# Patient Record
Sex: Female | Born: 1960
Health system: Southern US, Community
[De-identification: ages and names within clinical notes are randomized; demographics above are authoritative.]

## PROBLEM LIST (undated history)

## (undated) DIAGNOSIS — M8580 Other specified disorders of bone density and structure, unspecified site: Secondary | ICD-10-CM

## (undated) DIAGNOSIS — M542 Cervicalgia: Secondary | ICD-10-CM

## (undated) DIAGNOSIS — F419 Anxiety disorder, unspecified: Secondary | ICD-10-CM

## (undated) DIAGNOSIS — G4700 Insomnia, unspecified: Secondary | ICD-10-CM

## (undated) DIAGNOSIS — I340 Nonrheumatic mitral (valve) insufficiency: Secondary | ICD-10-CM

## (undated) DIAGNOSIS — E7800 Pure hypercholesterolemia, unspecified: Secondary | ICD-10-CM

## (undated) DIAGNOSIS — I491 Atrial premature depolarization: Secondary | ICD-10-CM

## (undated) DIAGNOSIS — I4949 Other premature depolarization: Secondary | ICD-10-CM

## (undated) DIAGNOSIS — I071 Rheumatic tricuspid insufficiency: Secondary | ICD-10-CM

## (undated) DIAGNOSIS — I10 Essential (primary) hypertension: Secondary | ICD-10-CM

## (undated) DIAGNOSIS — I493 Ventricular premature depolarization: Secondary | ICD-10-CM

## (undated) DIAGNOSIS — G43909 Migraine, unspecified, not intractable, without status migrainosus: Secondary | ICD-10-CM

## (undated) HISTORY — DX: Other premature depolarization: I49.49

## (undated) HISTORY — DX: Cervicalgia: M54.2

## (undated) HISTORY — DX: Anxiety disorder, unspecified: F41.9

## (undated) HISTORY — DX: Pure hypercholesterolemia, unspecified: E78.00

## (undated) HISTORY — DX: Rheumatic tricuspid insufficiency: I07.1

## (undated) HISTORY — PX: TUBAL LIGATION: SHX77

## (undated) HISTORY — DX: Atrial premature depolarization: I49.1

## (undated) HISTORY — DX: Insomnia, unspecified: G47.00

## (undated) HISTORY — DX: Ventricular premature depolarization: I49.3

## (undated) HISTORY — DX: Nonrheumatic mitral (valve) insufficiency: I34.0

## (undated) HISTORY — DX: Essential (primary) hypertension: I10

## (undated) HISTORY — DX: Other specified disorders of bone density and structure, unspecified site: M85.80

## (undated) HISTORY — DX: Migraine, unspecified, not intractable, without status migrainosus: G43.909

---

## 1999-10-19 ENCOUNTER — Ambulatory Visit (HOSPITAL_COMMUNITY): Admission: RE | Admit: 1999-10-19 | Discharge: 1999-10-19 | Payer: Self-pay | Admitting: Obstetrics & Gynecology

## 2000-07-10 HISTORY — PX: COLPOSCOPY: SHX161

## 2001-02-26 ENCOUNTER — Other Ambulatory Visit: Admission: RE | Admit: 2001-02-26 | Discharge: 2001-02-26 | Payer: Self-pay | Admitting: Obstetrics & Gynecology

## 2001-07-01 ENCOUNTER — Ambulatory Visit (HOSPITAL_COMMUNITY): Admission: RE | Admit: 2001-07-01 | Discharge: 2001-07-01 | Payer: Self-pay | Admitting: Family Medicine

## 2001-07-01 ENCOUNTER — Encounter: Payer: Self-pay | Admitting: Family Medicine

## 2001-07-18 ENCOUNTER — Other Ambulatory Visit: Admission: RE | Admit: 2001-07-18 | Discharge: 2001-07-18 | Payer: Self-pay | Admitting: Obstetrics & Gynecology

## 2002-04-07 ENCOUNTER — Other Ambulatory Visit: Admission: RE | Admit: 2002-04-07 | Discharge: 2002-04-07 | Payer: Self-pay | Admitting: Obstetrics & Gynecology

## 2002-07-07 ENCOUNTER — Ambulatory Visit (HOSPITAL_COMMUNITY): Admission: RE | Admit: 2002-07-07 | Discharge: 2002-07-07 | Payer: Self-pay | Admitting: Obstetrics & Gynecology

## 2002-07-07 ENCOUNTER — Encounter: Payer: Self-pay | Admitting: Obstetrics & Gynecology

## 2002-07-10 HISTORY — PX: ABDOMINAL HYSTERECTOMY: SHX81

## 2002-11-27 ENCOUNTER — Encounter: Admission: RE | Admit: 2002-11-27 | Discharge: 2002-11-27 | Payer: Self-pay | Admitting: Otolaryngology

## 2002-11-27 ENCOUNTER — Encounter: Payer: Self-pay | Admitting: Otolaryngology

## 2003-05-06 ENCOUNTER — Other Ambulatory Visit: Admission: RE | Admit: 2003-05-06 | Discharge: 2003-05-06 | Payer: Self-pay | Admitting: Obstetrics & Gynecology

## 2003-06-01 ENCOUNTER — Observation Stay (HOSPITAL_COMMUNITY): Admission: RE | Admit: 2003-06-01 | Discharge: 2003-06-03 | Payer: Self-pay | Admitting: Obstetrics & Gynecology

## 2003-06-01 ENCOUNTER — Encounter (INDEPENDENT_AMBULATORY_CARE_PROVIDER_SITE_OTHER): Payer: Self-pay | Admitting: Specialist

## 2003-07-09 ENCOUNTER — Ambulatory Visit (HOSPITAL_COMMUNITY): Admission: RE | Admit: 2003-07-09 | Discharge: 2003-07-09 | Payer: Self-pay | Admitting: Obstetrics & Gynecology

## 2004-08-08 ENCOUNTER — Ambulatory Visit (HOSPITAL_COMMUNITY): Admission: RE | Admit: 2004-08-08 | Discharge: 2004-08-08 | Payer: Self-pay | Admitting: Obstetrics & Gynecology

## 2005-08-11 ENCOUNTER — Ambulatory Visit (HOSPITAL_COMMUNITY): Admission: RE | Admit: 2005-08-11 | Discharge: 2005-08-11 | Payer: Self-pay | Admitting: Obstetrics & Gynecology

## 2006-08-13 ENCOUNTER — Ambulatory Visit (HOSPITAL_COMMUNITY): Admission: RE | Admit: 2006-08-13 | Discharge: 2006-08-13 | Payer: Self-pay | Admitting: Obstetrics & Gynecology

## 2007-09-05 ENCOUNTER — Ambulatory Visit (HOSPITAL_COMMUNITY): Admission: RE | Admit: 2007-09-05 | Discharge: 2007-09-05 | Payer: Self-pay | Admitting: Obstetrics & Gynecology

## 2008-08-10 ENCOUNTER — Emergency Department (HOSPITAL_BASED_OUTPATIENT_CLINIC_OR_DEPARTMENT_OTHER): Admission: EM | Admit: 2008-08-10 | Discharge: 2008-08-10 | Payer: Self-pay | Admitting: Emergency Medicine

## 2008-08-10 ENCOUNTER — Ambulatory Visit: Payer: Self-pay | Admitting: Diagnostic Radiology

## 2008-09-15 ENCOUNTER — Ambulatory Visit (HOSPITAL_COMMUNITY): Admission: RE | Admit: 2008-09-15 | Discharge: 2008-09-15 | Payer: Self-pay | Admitting: Obstetrics & Gynecology

## 2009-08-11 ENCOUNTER — Encounter: Admission: RE | Admit: 2009-08-11 | Discharge: 2009-08-11 | Payer: Self-pay | Admitting: Internal Medicine

## 2009-09-29 ENCOUNTER — Ambulatory Visit (HOSPITAL_COMMUNITY): Admission: RE | Admit: 2009-09-29 | Discharge: 2009-09-29 | Payer: Self-pay | Admitting: Obstetrics & Gynecology

## 2010-07-30 ENCOUNTER — Encounter: Payer: Self-pay | Admitting: Obstetrics & Gynecology

## 2010-07-31 ENCOUNTER — Encounter: Payer: Self-pay | Admitting: Orthopaedic Surgery

## 2010-08-30 ENCOUNTER — Other Ambulatory Visit (HOSPITAL_COMMUNITY): Payer: Self-pay | Admitting: Obstetrics & Gynecology

## 2010-08-30 DIAGNOSIS — Z1231 Encounter for screening mammogram for malignant neoplasm of breast: Secondary | ICD-10-CM

## 2010-09-21 ENCOUNTER — Other Ambulatory Visit: Payer: Self-pay | Admitting: Obstetrics & Gynecology

## 2010-09-22 ENCOUNTER — Other Ambulatory Visit (HOSPITAL_COMMUNITY): Payer: Self-pay | Admitting: Obstetrics & Gynecology

## 2010-09-22 DIAGNOSIS — Z780 Asymptomatic menopausal state: Secondary | ICD-10-CM

## 2010-10-03 ENCOUNTER — Ambulatory Visit (HOSPITAL_COMMUNITY)
Admission: RE | Admit: 2010-10-03 | Discharge: 2010-10-03 | Disposition: A | Discharge status: Home / Self Care | Payer: BC Managed Care – PPO | Source: Ambulatory Visit | Attending: Obstetrics & Gynecology | Admitting: Obstetrics & Gynecology

## 2010-10-03 DIAGNOSIS — Z1231 Encounter for screening mammogram for malignant neoplasm of breast: Secondary | ICD-10-CM

## 2010-10-03 DIAGNOSIS — Z780 Asymptomatic menopausal state: Secondary | ICD-10-CM

## 2010-10-18 ENCOUNTER — Other Ambulatory Visit: Payer: Self-pay | Admitting: Gastroenterology

## 2010-10-25 LAB — CBC
HCT: 40 % (ref 36.0–46.0)
Hemoglobin: 13.8 g/dL (ref 12.0–15.0)
MCHC: 34.4 g/dL (ref 30.0–36.0)
MCV: 90.3 fL (ref 78.0–100.0)
Platelets: 269 10*3/uL (ref 150–400)
RBC: 4.43 MIL/uL (ref 3.87–5.11)
RDW: 11.5 % (ref 11.5–15.5)
WBC: 7.4 10*3/uL (ref 4.0–10.5)

## 2010-10-25 LAB — HEPATIC FUNCTION PANEL
ALT: 12 U/L (ref 0–35)
AST: 27 U/L (ref 0–37)
Albumin: 4.2 g/dL (ref 3.5–5.2)
Alkaline Phosphatase: 57 U/L (ref 39–117)
Bilirubin, Direct: 0 mg/dL (ref 0.0–0.3)
Indirect Bilirubin: 0.4 mg/dL (ref 0.3–0.9)
Total Bilirubin: 0.4 mg/dL (ref 0.3–1.2)
Total Protein: 7.2 g/dL (ref 6.0–8.3)

## 2010-10-25 LAB — POCT CARDIAC MARKERS
CKMB, poc: <1 ng/mL — ABNORMAL LOW (ref 1.0–8.0)
CKMB, poc: <1 ng/mL — ABNORMAL LOW (ref 1.0–8.0)
CKMB, poc: <1 ng/mL — ABNORMAL LOW (ref 1.0–8.0)
Myoglobin, poc: 32.6 ng/mL (ref 12–200)
Myoglobin, poc: 35 ng/mL (ref 12–200)
Myoglobin, poc: 38.6 ng/mL (ref 12–200)
Troponin i, poc: <0.05 ng/mL (ref 0.00–0.09)
Troponin i, poc: <0.05 ng/mL (ref 0.00–0.09)
Troponin i, poc: <0.05 ng/mL (ref 0.00–0.09)

## 2010-10-25 LAB — BASIC METABOLIC PANEL
BUN: 15 mg/dL (ref 6–23)
CO2: 27 mEq/L (ref 19–32)
Calcium: 8.9 mg/dL (ref 8.4–10.5)
Chloride: 105 mEq/L (ref 96–112)
Creatinine, Ser: 0.7 mg/dL (ref 0.4–1.2)
GFR calc Af Amer: >60 mL/min (ref >60)
GFR calc non Af Amer: >60 mL/min (ref >60)
Glucose, Bld: 92 mg/dL (ref 70–99)
Potassium: 3.8 mEq/L (ref 3.5–5.1)
Sodium: 138 mEq/L (ref 135–145)

## 2010-10-25 LAB — D-DIMER, QUANTITATIVE: D-Dimer, Quant: 0.29 ug/mL-FEU (ref 0.00–0.48)

## 2010-10-25 LAB — LIPASE, BLOOD: Lipase: 187 U/L (ref 23–300)

## 2010-11-25 NOTE — Op Note (Signed)
[facility]  Patient:    Alicia Irwin, Alicia Irwin                        MRN: [id number] Proc. Date: 10/19/99 Adm. Date:  [id number] Attending:  Genia Del                           Operative Report  PREOPERATIVE DIAGNOSIS:       Desires tubal sterilization.  POSTOPERATIVE DIAGNOSIS:      Desires tubal sterilization.  OPERATION:                    Bilateral tubal sterilization with electrocauterization under laparoscopy.  SURGEON:                      Genia Del, M.D.  ASSISTANT:  ANESTHESIA:                   Vita Erm, Montez Hageman., M.D.  ESTIMATED BLOOD LOSS:  DESCRIPTION OF PROCEDURE:     Under general anesthesia with endotracheal intubation, the patient is in the lithotomy position.  She is prepped with Betadine on the abdominal, suprapubic, vulvar, and vaginal areas.  The bladder catheter s done and the vaginal examination reveals an anteverted uterus, normal volume, mobile, and no adnexal mass.  The patient is draped as usual.  The speculum is introduced in the vagina.  The anterior lip of the cervix is grasped and the uterus is cannulated.  The speculum is removed.  We then proceed abdominally.  An infraumbilical incision is done with a scalpel on 10 mm.  The Veress needle is introduced while raising the abdominal wall.  A pneumoperitoneum is created with 3 liters of CO2.  When achieved, the Veress needle was removed.  The trocar is introduced as well as the laparoscope.  The pelvic cavity is inspected.  The uterus is normal in volume and appearance.  The two tubes are mobile and normal.  The wo ovaries are normal in volume and in appearance.  No pelvic lesion is present. e proceeded with cauterization of the left tube first at about 2 cm from the cornua on a distance of about 2 cm taking two bites and then in between and covering the whole width of the tube down to the broad ligament.  We proceeded exactly the same on  the right side.  Hemostasis is adequate.  Pictures are taken after tubal sterilization is done.  We then removed the instruments.  The CO2 is evacuated.  The aponeurosis is closed with a Vicryl 0 and then the skin is closed with Monocryl 4-0.  Infiltration of the subcutaneous tissue is done with Marcaine 0.25%.  The  instrument in the vagina is also removed.  The estimated blood loss was minimal, no complications occurred, and the patient was brought to the recovery room in good condition. DD:  10/19/99 TD:  10/20/99 Job: [id number]

## 2010-11-25 NOTE — H&P (Signed)
NAME:  Alicia Irwin, Alicia Irwin                           ACCOUNT NO.:  [id number]   MEDICAL RECORD NO.:  [id number]                   PATIENT TYPE:  AMB   LOCATION:  SDC                                  FACILITY:  [facility]   PHYSICIAN:  Genia Del, M.D.             DATE OF BIRTH:  08-Oct-1960   DATE OF ADMISSION:  DATE OF DISCHARGE:                                HISTORY & PHYSICAL   HISTORY:  The patient is a 50 year old Gravida II, Para 2 status post  bilateral tubal sterilization.   REASON FOR ADMISSION:  Long-standing persistent, refractory menometrorrhagia  for laparoscopic assisted vaginal hysterectomy.   HISTORY OF PRESENT ILLNESS:  The patient has had long-standing persistent  menometrorrhagia for which many attempts at medical treatment have been  made.  She cannot tolerate birth control pills secondary to nausea and  headaches.  She even attempted Nuvaring which brought the same side effects.  She is currently on Provera with which she has been bleeding on and off, but  less heavily.  She made many phone calls to the office because of lower  abdominal pains and bleeding.  She is currently on Darvocet-N 100 q.4h  p.r.n. waiting for her surgery.  The surgery was initially planned on  June 11, 2003, but because of her significant symptoms it was advanced.   PAST MEDICAL HISTORY:  1. Rhinitis.   PAST SURGICAL HISTORY:  1. Bilateral tubal sterilization by electrocauterization by laparoscopy on     October 19, 1999.   MEDICATIONS:  1. Flonase p.r.n.  2. Allegra p.r.n.   ALLERGIES:  No known drug allergies.   FAMILY HISTORY:  Noncontributory.   REVIEW OF SYMPTOMS:  CONSTITUTIONAL:  Negative.  HEENT:  Positive for  occasional nasal congestion.  RESPIRATORY:  Negative.  CARDIOVASCULAR:  Negative.  GI:  Negative.  URO:  Negative.  MUSCULOSKELETAL:  Negative.  NEUROLOGIC:  Occasional headaches.  ENDOCRINE:  Negative.  DERMATOLOGIC:  Negative.   PHYSICAL EXAMINATION:  GENERAL:   She is in no apparent distress.  VITAL SIGNS:  Blood pressure is 120 to 130 over 78 to 88; one blood pressure  on May 13, 2003 was elevated at 136/100 associated with stress.  The  patient was advised to follow-up with her primary care physician.  We will  also follow-up at the time of surgery.  Pulse 80 and regular, respiratory  rate 20, temperature is afebrile.  LUNGS:  Clear bilaterally.  CARDIOVASCULAR:  Regular cardiac rhythm.  ABDOMEN:  Soft and non-tender.  No hepatosplenomegaly.  No masses felt.  GYN EXAM:  Vulva normal, vagina and cervix normal.  Last Pap test was within  normal limits on May 06, 2003.  Uterus anteverted, about 9 cm, slightly  irregular, mobile.  No adnexal mass. Non-tender.  EXTREMITIES:  Lower limbs are normal with no edema and good pulses.   LABORATORY DATA:  Pelvic ultrasound on May 06, 2003 showed three  intrauterine myomas with one possible submucosal component.  They were  ranging from 2.2 to 2.9 cm.  Right ovary is within normal limits.  Left  ovary within normal limits with a small simple cyst of 2.8 cm.  No free  fluid.   Based on that ultrasound the patient has contemplated a possible  hysteroscopy with myomectomy and cryoablation, but later on because of all  the lower abdominal pains and the long-standing refractory uterine bleeding  she opted for a definitive surgery with an LAVH.   IMPRESSION:  Long-standing persistent, refractory menometrorrhagia with  intramural and possibly submucosal myoma.   PLAN:  Laparoscopic assisted vaginal hysterectomy.  We will use antiembolic  stockings to the thighs and PAS to the thighs. We will also discuss with the  patient the possible coverage for deep venous thrombosis and pulmonary  embolism with low dose heparin.  We will give cefazolin 1 gram IV at  induction.  The patient will prepare her bowels with NuLYTELY.  She will be  NPO from midnight the day before surgery.  Surgical risks were  discussed  including hemorrhage, infection, trauma especially to the bowels, the  ureters and to major blood vessels as well as to the bladder, risks of deep  venous thrombosis and pulmonary embolism discussed and anesthesia risks  briefly reviewed.  Those will be discussed further with the  anesthesiologist.  The procedure was discussed with the patient and she was  informed that if not possible by laparoscopy, a laparotomy approach will  have to be done.  The decision to preserve her ovaries, risks and advantages  were discussed.  The patient voiced agreement and understanding.                                               Genia Del, M.D.    ML/MEDQ  D:  05/31/2003  T:  05/31/2003  Job:  [id number]

## 2010-11-25 NOTE — Op Note (Signed)
NAME:  GIRL, SCHISSLER                           ACCOUNT NO.:  [id number]   MEDICAL RECORD NO.:  [id number]                   PATIENT TYPE:  OBV   LOCATION:  9304                                 FACILITY:  [facility]   PHYSICIAN:  Genia Del, M.D.             DATE OF BIRTH:  05/14/1961   DATE OF PROCEDURE:  06/01/2003  DATE OF DISCHARGE:                                 OPERATIVE REPORT   PREOPERATIVE DIAGNOSIS:  Refractory persistent menometrorrhagia with uterine  myomas.   POSTOPERATIVE DIAGNOSIS:  Refractory persistent menometrorrhagia with  uterine myomas.   INTERVENTION:  Laparoscopically-assisted vaginal hysterectomy.   SURGEON:  Genia Del, M.D.   ASSISTANT:  Pershing Cox, M.D.   ANESTHESIOLOGIST:  Belva Agee, M.D.   DESCRIPTION OF PROCEDURE:  Under general anesthesia with endotracheal  intubation, the patient is in lithotomy position for operative laparoscopy.  She is prepped with Hibiclens on the abdominal, suprapubic, vulvar, and  vaginal area.  The vaginal exam reveals an anteverted uterus about 8 cm,  mobile, no adnexal mass.  The patient is draped as usual.  The speculum exam  is done, the uterus is cannulated, and the speculum is removed.  We then go  abdominally.  An infraumbilical incision is made with a scalpel over 1.5 cm  after infiltrating the subcutaneous tissue with Marcaine 0.25%.  We then  proceed with an open procedure, opening the aponeurosis with the Mayo  scissors under direct vision and then opening the parietal peritoneum  bluntly with a finger.  We enter the Hasson and the laparoscope at that  level after putting a pursestring suture of 0 Vicryl at the level of the  aponeurosis.  The inspection of the pelvic cavity reveals a slightly  irregular uterus about 8 cm.  No lesion in the pelvic cavity.  Both tubes  have had a tubal sterilization by cauterization.  The right ovary is normal  in appearance and size.  The left ovary is  normal in appearance in size with  a small simple-looking cyst about 2 cm.  We verify the abdominal cavity.  The liver is normal.  The bowels are normal in appearance, and the appendix  is seen and appears normal.  Adhesions are present between the anterior wall  and the omentum toward the left.  Those do not interfere with the surgery.  We make two counter incisions at the left and right iliac areas with the  scalpel over 5 mm after infiltrating the subcutaneous tissue with Marcaine  0.25%.  We enter under direct vision after verifying the location of the  vessels.  The instruments are put in place, an atraumatic clamp and the  tripolar.  We visualize the ureters on side that are in normal anatomic  position.  We cauterize successively the left round ligament, the left utero-  ovarian ligament, and follow the uterus down to just before the left uterine  artery.  We open the visceroperitoneum anteriorly and bring the bladder  down.  We proceed exactly the same way on the right side.  Hemostasis is  adequate at all levels.  We remove the instruments and leave the trocars in  place.  We cover with a drape and pass to vaginal time.  The patient is  repositioned.  We put the weighted speculum in place.  The intrauterine  cannula is removed and replaced by a tenaculum.  We infiltrate the junction  between the cervix and vaginal mucosa with Xylocaine plus epinephrine all  around.  We then open in a circular fashion with the scalpel.  We then open  the peritoneum anteriorly and use a retractor to push the bladder away from  the surgical field.  We open also posteriorly and insert the long, narrow  weighted speculum.  We clamp the left uterosacral ligament with a Heaney.  We section and suture with 0 Vicryl on the left side, then the same way on  the right side.  We then use Heaneys to clamp, cut, and suture the uterine  arteries on each side and last clamp on each side to finish a hysterectomy.   We clamp, cut with Mayo scissors, and suture with 0 Vicryl.  Hemostasis is  adequate at all pedicles.  We switch the weighted speculum to a short one.  We then put one stitch to reapproximate the uterosacral ligaments on the  midline.  We then do the suspensory suture on the left side and on the right  side with 0 Vicryl, taking the anterior vaginal vault, the peritoneum, the  uterosacral ligaments, and the superior peritoneum and vaginal vault.  Those  are attached.  The posterior vaginal vault was also sutured for hemostasis  with a locked running suture of 0 Vicryl.  We then attached the two  uterosacral ligaments in the midline.  We close the vaginal vault from  anterior to posterior with X-stitches of 0 Vicryl.  Hemostasis is adequate  at all levels. The instruments are removed vaginally.  We go to laparoscopic  time again.  Irrigation and suction are done.  Hemostasis is adequate at all  levels except slightly toward the left at the vaginal vault.  This is  completed with the tripolar.  We remove a little bit of gas to decrease the  pressure and hemostasis is adequate.  We remove all instruments and the  trocars are removed under direct vision.  The CO2 is evacuated.  We close  the infraumbilical incision with a subcuticular 4-0 Vicryl, and we close the  iliac incisions with Dermabond.  Hemostasis is adequate at all levels.  The  estimated blood loss was 125 mL, no complication occurred, and the patient  was brought to the recovery room in good status.                                               Genia Del, M.D.    ML/MEDQ  D:  06/01/2003  T:  06/01/2003  Job:  [id number]

## 2010-11-25 NOTE — Discharge Summary (Signed)
NAME:  Alicia Irwin, Alicia Irwin                           ACCOUNT NO.:  [id number]   MEDICAL RECORD NO.:  [id number]                   PATIENT TYPE:  INP   LOCATION:  9304                                 FACILITY:  [facility]   PHYSICIAN:  Genia Del, M.D.             DATE OF BIRTH:  [birth year]   DATE OF ADMISSION:  06/01/2003  DATE OF DISCHARGE:  06/03/2003                                 DISCHARGE SUMMARY   ADMISSION DIAGNOSIS:  Refractory persistent menometrorrhagia with uterine  myomas.   DISCHARGE DIAGNOSES:  1. Refractory persistent menometrorrhagia with uterine myomas.  2. Possible heart murmur and borderline increased blood pressure.   INTERVENTION:  Laparoscopy-assisted vaginal hysterectomy with preservation  of adnexa.   HOSPITAL COURSE:  The patient was admitted on June 01, 2003, where  surgery was done on the same day.  No complication occurred. The estimated  blood loss was 125 mL.  Both ovaries were conserved.  They were normal in  appearance and size. The postoperative course was uneventful. Note that a  mild systolic murmur 1-2 parasternal was present. Her blood pressures postop  were normal, but preop they had been increased. The patient was therefore  advised to consult with Cardio after discharge. She remained hemodynamically  stable, afebrile. Her postop hemoglobin was 10.3. She was discharged on  postop day #2 on June 03, 2003, in good status. Postop advice was given.  Tylox was prescribed p.r.n. She will take Motrin over-the-counter and follow  up with me at [facility] in three weeks.                                               Genia Del, M.D.    ML/MEDQ  D:  07/04/2003  T:  07/04/2003  Job:  [id number]

## 2011-09-12 ENCOUNTER — Other Ambulatory Visit (HOSPITAL_COMMUNITY): Payer: Self-pay | Admitting: Obstetrics & Gynecology

## 2011-09-12 DIAGNOSIS — Z1231 Encounter for screening mammogram for malignant neoplasm of breast: Secondary | ICD-10-CM

## 2011-10-05 ENCOUNTER — Ambulatory Visit (HOSPITAL_COMMUNITY)
Admission: RE | Admit: 2011-10-05 | Discharge: 2011-10-05 | Disposition: A | Discharge status: Home / Self Care | Payer: BC Managed Care – PPO | Source: Ambulatory Visit | Attending: Obstetrics & Gynecology | Admitting: Obstetrics & Gynecology

## 2011-10-05 DIAGNOSIS — Z1231 Encounter for screening mammogram for malignant neoplasm of breast: Secondary | ICD-10-CM | POA: Insufficient documentation

## 2012-08-14 ENCOUNTER — Other Ambulatory Visit (HOSPITAL_COMMUNITY): Payer: Self-pay | Admitting: Obstetrics & Gynecology

## 2012-08-14 DIAGNOSIS — Z1231 Encounter for screening mammogram for malignant neoplasm of breast: Secondary | ICD-10-CM

## 2012-10-07 ENCOUNTER — Ambulatory Visit (HOSPITAL_COMMUNITY)
Admission: RE | Admit: 2012-10-07 | Discharge: 2012-10-07 | Disposition: A | Discharge status: Home / Self Care | Payer: BC Managed Care – PPO | Source: Ambulatory Visit | Attending: Obstetrics & Gynecology | Admitting: Obstetrics & Gynecology

## 2012-10-07 DIAGNOSIS — Z1231 Encounter for screening mammogram for malignant neoplasm of breast: Secondary | ICD-10-CM | POA: Insufficient documentation

## 2012-12-12 ENCOUNTER — Other Ambulatory Visit: Payer: Self-pay | Admitting: Internal Medicine

## 2012-12-12 ENCOUNTER — Ambulatory Visit
Admission: RE | Admit: 2012-12-12 | Discharge: 2012-12-12 | Disposition: A | Discharge status: Home / Self Care | Payer: BC Managed Care – PPO | Source: Ambulatory Visit | Attending: Internal Medicine | Admitting: Internal Medicine

## 2012-12-12 DIAGNOSIS — R609 Edema, unspecified: Secondary | ICD-10-CM

## 2013-05-22 ENCOUNTER — Encounter: Payer: Self-pay | Admitting: *Deleted

## 2013-05-22 ENCOUNTER — Encounter: Payer: Self-pay | Admitting: Cardiology

## 2013-05-22 DIAGNOSIS — M542 Cervicalgia: Secondary | ICD-10-CM | POA: Insufficient documentation

## 2013-05-22 DIAGNOSIS — E7800 Pure hypercholesterolemia, unspecified: Secondary | ICD-10-CM | POA: Insufficient documentation

## 2013-05-22 DIAGNOSIS — I10 Essential (primary) hypertension: Secondary | ICD-10-CM | POA: Insufficient documentation

## 2013-05-22 DIAGNOSIS — F419 Anxiety disorder, unspecified: Secondary | ICD-10-CM | POA: Insufficient documentation

## 2013-05-22 DIAGNOSIS — G4700 Insomnia, unspecified: Secondary | ICD-10-CM | POA: Insufficient documentation

## 2013-05-23 ENCOUNTER — Encounter (INDEPENDENT_AMBULATORY_CARE_PROVIDER_SITE_OTHER): Payer: Self-pay

## 2013-05-23 ENCOUNTER — Encounter: Payer: Self-pay | Admitting: Cardiology

## 2013-05-23 ENCOUNTER — Ambulatory Visit (INDEPENDENT_AMBULATORY_CARE_PROVIDER_SITE_OTHER): Payer: BC Managed Care – PPO | Admitting: Cardiology

## 2013-05-23 VITALS — BP 156/88 | HR 68 | Ht 64.0 in | Wt 125.0 lb

## 2013-05-23 DIAGNOSIS — I10 Essential (primary) hypertension: Secondary | ICD-10-CM

## 2013-05-23 DIAGNOSIS — I340 Nonrheumatic mitral (valve) insufficiency: Secondary | ICD-10-CM | POA: Insufficient documentation

## 2013-05-23 DIAGNOSIS — I4949 Other premature depolarization: Secondary | ICD-10-CM

## 2013-05-23 DIAGNOSIS — I491 Atrial premature depolarization: Secondary | ICD-10-CM

## 2013-05-23 DIAGNOSIS — I059 Rheumatic mitral valve disease, unspecified: Secondary | ICD-10-CM

## 2013-05-23 DIAGNOSIS — I493 Ventricular premature depolarization: Secondary | ICD-10-CM

## 2013-05-23 LAB — BASIC METABOLIC PANEL
BUN: 18 mg/dL (ref 6–23)
CO2: 29 mEq/L (ref 19–32)
Calcium: 9.4 mg/dL (ref 8.4–10.5)
Chloride: 103 mEq/L (ref 96–112)
Creatinine, Ser: 0.7 mg/dL (ref 0.4–1.2)
GFR: 101.31 mL/min (ref >60.00)
Glucose, Bld: 88 mg/dL (ref 70–99)
Potassium: 3.7 mEq/L (ref 3.5–5.1)
Sodium: 137 mEq/L (ref 135–145)

## 2013-05-23 NOTE — Patient Instructions (Signed)
Your physician recommends that you continue on your current medications as directed. Please refer to the Current Medication list given to you today.  Your physician recommends that you go to the lab today for a BMET Panel to be drawn  Your physician wants you to follow-up in: 6 Months with Dr Sherlyn Lick will receive a reminder letter in the mail two months in advance. If you don't receive a letter, please call our office to schedule the follow-up appointment.

## 2013-05-23 NOTE — Progress Notes (Signed)
  [address] [place], [place]  [address] Phone: [phone] Fax:  [phone]  Date:  05/23/2013  -------- ID:  Alicia Irwin, DOB 1960-09-14, MRN [id number]  PCP:  Katy Apo, MD  Cardiologist:  Armanda Magic, MD     History of Present Illness: Alicia Irwin is a 52 y.o. female with a history of PAC's, PVC's and HTN who presents today for followup.  She is doing well.  She denies any chest pain, SOB, DOE, LE edema, dizziness  or syncope.  She did not tolerate Bystolic in the past and decided not to try metoprolol.  She says that since I saw her last the palpitations improved from what they had been.  She started an all natural product food line and lost 10 pounds and feels better.  Her BP has been very well controlled at home.  The systolic has been in the range.  She says now within the past month her palpitations have been present more and she thinks she is doing too much of this diet instead of real food and she plans to cut back.    Wt Readings from Last 3 Encounters:  05/23/13 125 lb (56.7 kg)     Past Medical History  Diagnosis Date  . Cervicalgia   . Insomnia   . Anxiety   . HTN (hypertension)   . Migraine headache   . Hypercholesteremia   . Mitral regurgitation     mild to moderate by echo 07/2012  . Other premature beats     PVC's/PAC's intolerant to bystolic, not taking metoprolol  . PVC's (premature ventricular contractions)   . PAC (premature atrial contraction)     Current Outpatient Prescriptions  Medication Sig Dispense Refill  . CLIMARA 0.06 MG/24HR Place 1 patch onto the skin once a week.        No current facility-administered medications for this visit.    Allergies:   No Known Allergies  Social History:  The patient  reports that she has never smoked. She does not have any smokeless tobacco history on file. She reports that she drinks alcohol. She reports that she does not use illicit drugs.   Family History:  The patient's  family history includes Aortic aneurysm in her mother; Hypertension in her father.   ROS:  Please see the history of present illness.      All other systems reviewed and negative.   PHYSICAL EXAM: VS:  BP 156/88  Pulse 68  Ht 5\' 4"  (1.626 m)  Wt 125 lb (56.7 kg)  BMI 21.45 kg/m2 Well nourished, well developed, in no acute distress HEENT: normal Neck: no JVD Cardiac:  normal S1, S2; RRR; no murmuroccasional ectopy Lungs:  clear to auscultation bilaterally, no wheezing, rhonchi or rales Abd: soft, nontender, no hepatomegaly Ext: no edema Skin: warm and dry Neuro:  CNs 2-12 intact, no focal abnormalities noted   EKG:  NSR     ASSESSMENT AND PLAN:  1. PAC's and PVC's - increased in frequency since starting this new diet program.    - check a BMET/TSH  - I have recommended that she consider cutting back on this special clensing diet program especially it has increased her LDL and consider doing weight watchers instead 2. HTN - mildly elevated  - I have encouraged her to get into an aerobic exercise program  Followup with me in 6 months  Signed, Armanda Magic, MD 05/23/2013 10:48 AM

## 2013-06-23 ENCOUNTER — Other Ambulatory Visit: Payer: Self-pay | Admitting: Nurse Practitioner

## 2013-06-23 DIAGNOSIS — R1032 Left lower quadrant pain: Secondary | ICD-10-CM

## 2013-06-24 ENCOUNTER — Ambulatory Visit
Admission: RE | Admit: 2013-06-24 | Discharge: 2013-06-24 | Disposition: A | Discharge status: Home / Self Care | Payer: BC Managed Care – PPO | Source: Ambulatory Visit | Attending: Nurse Practitioner | Admitting: Nurse Practitioner

## 2013-06-24 DIAGNOSIS — R1032 Left lower quadrant pain: Secondary | ICD-10-CM

## 2013-10-02 ENCOUNTER — Other Ambulatory Visit (HOSPITAL_COMMUNITY): Payer: Self-pay | Admitting: Obstetrics & Gynecology

## 2013-10-02 DIAGNOSIS — Z1231 Encounter for screening mammogram for malignant neoplasm of breast: Secondary | ICD-10-CM

## 2013-10-09 ENCOUNTER — Ambulatory Visit (HOSPITAL_COMMUNITY)
Admission: RE | Admit: 2013-10-09 | Discharge: 2013-10-09 | Disposition: A | Discharge status: Home / Self Care | Payer: BC Managed Care – PPO | Source: Ambulatory Visit | Attending: Obstetrics & Gynecology | Admitting: Obstetrics & Gynecology

## 2013-10-09 DIAGNOSIS — Z1231 Encounter for screening mammogram for malignant neoplasm of breast: Secondary | ICD-10-CM | POA: Insufficient documentation

## 2013-10-21 ENCOUNTER — Other Ambulatory Visit: Payer: Self-pay | Admitting: Internal Medicine

## 2013-10-21 DIAGNOSIS — Z8249 Family history of ischemic heart disease and other diseases of the circulatory system: Secondary | ICD-10-CM

## 2013-10-21 DIAGNOSIS — M545 Low back pain, unspecified: Secondary | ICD-10-CM

## 2013-10-23 ENCOUNTER — Ambulatory Visit
Admission: RE | Admit: 2013-10-23 | Discharge: 2013-10-23 | Disposition: A | Discharge status: Home / Self Care | Payer: BC Managed Care – PPO | Source: Ambulatory Visit | Attending: Internal Medicine | Admitting: Internal Medicine

## 2013-10-23 DIAGNOSIS — M545 Low back pain, unspecified: Secondary | ICD-10-CM

## 2013-10-23 DIAGNOSIS — Z8249 Family history of ischemic heart disease and other diseases of the circulatory system: Secondary | ICD-10-CM

## 2013-12-19 ENCOUNTER — Ambulatory Visit: Payer: BC Managed Care – PPO | Admitting: Cardiology

## 2014-01-02 IMAGING — US US EXTREM LOW VENOUS*R*
1 series · 14 of 24 positions shown · non-contrast
Comparison: None.

CLINICAL DATA: Right leg swelling

RIGHT LOWER EXTREMITY VENOUS DUPLEX ULTRASOUND
TECHNIQUE: Gray-scale sonography with graded compression, as well
as color Doppler and duplex ultrasound, were performed to evaluate
the deep venous system of the lower extremity from the level of the
common femoral vein through the popliteal and proximal calf veins.
Spectral Doppler was utilized to evaluate flow at rest and with
distal augmentation maneuvers.

[Series 1: us extrem low venous*right* · 14 of 34 slices shown]
[im 1/34]
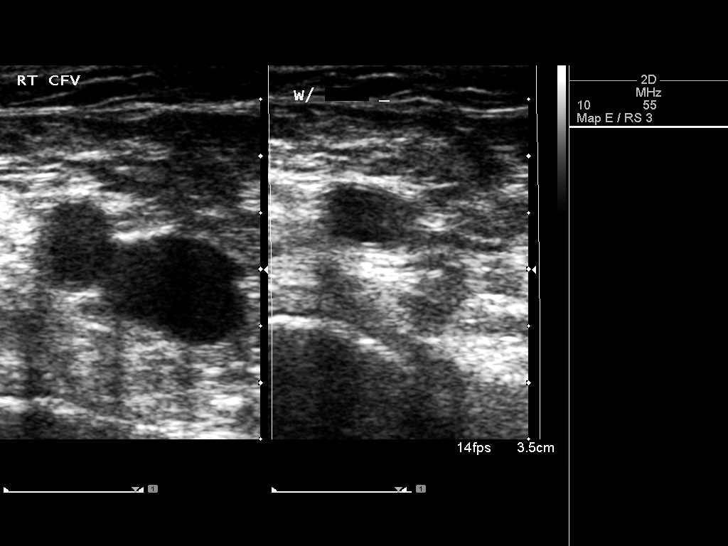
[im 3/34]
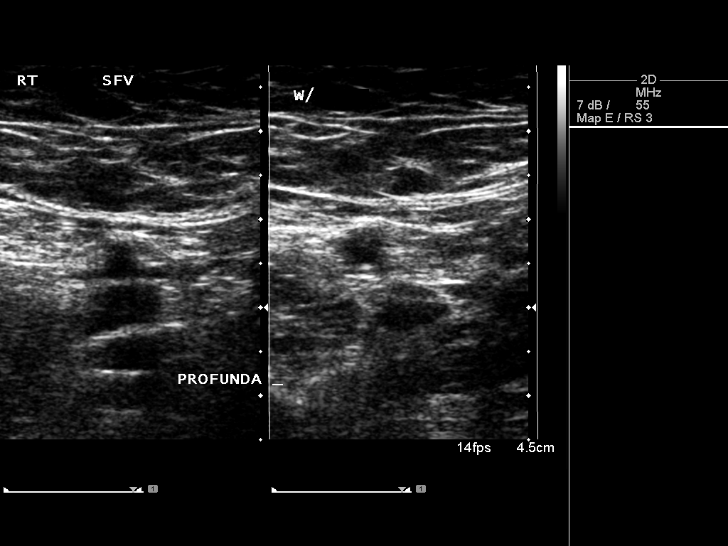
[im 6/34]
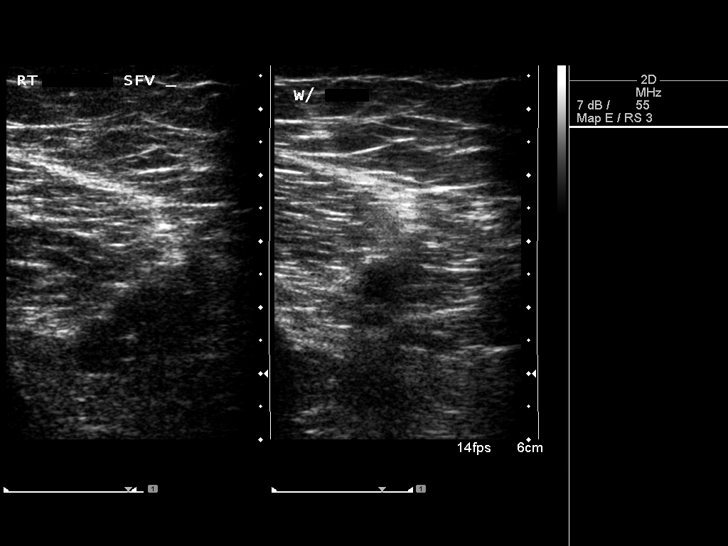
[im 9/34]
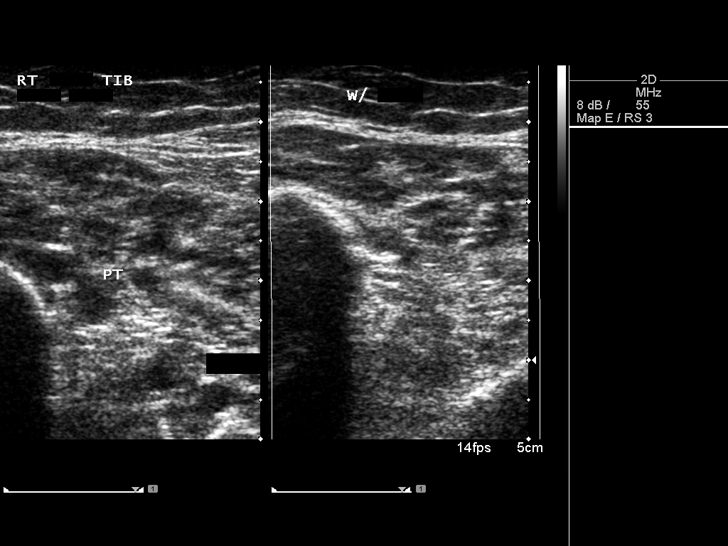
[im 11/34]
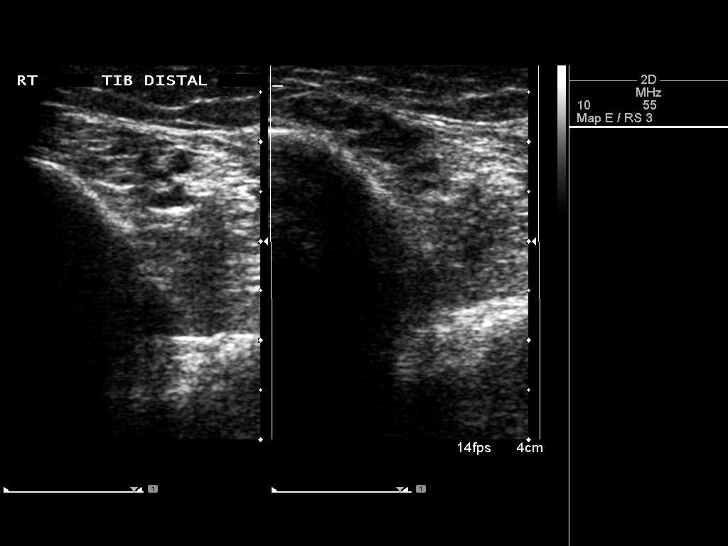
[im 13/34]
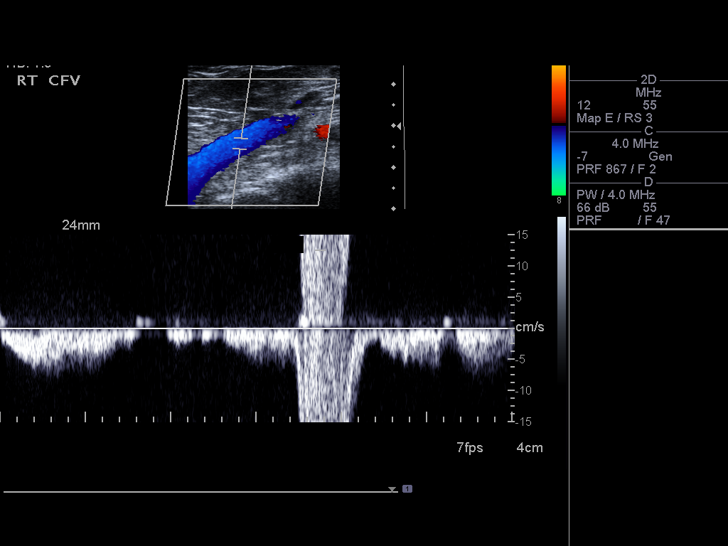
[im 16/34]
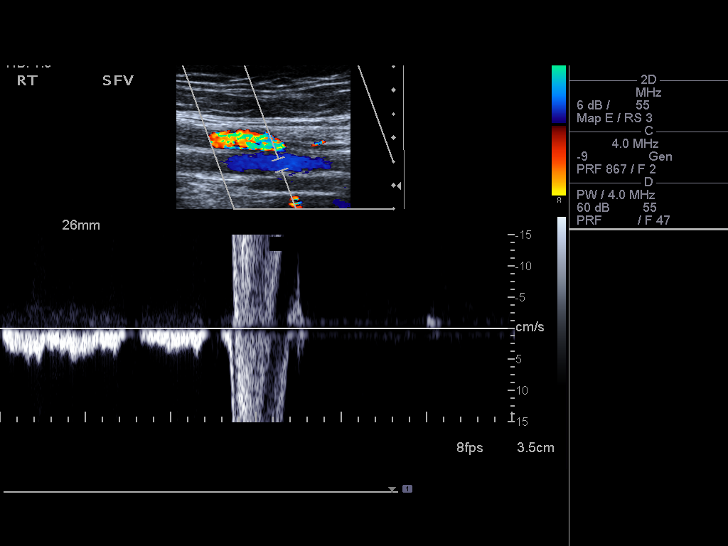
[im 18/34]
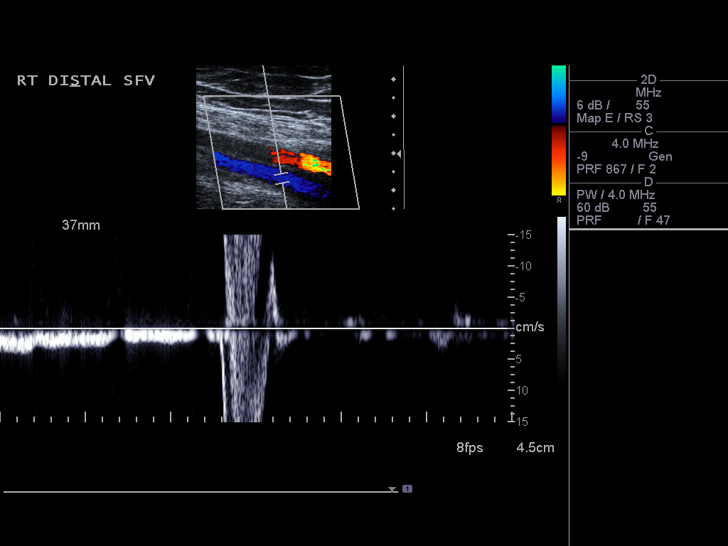
[im 21/34]
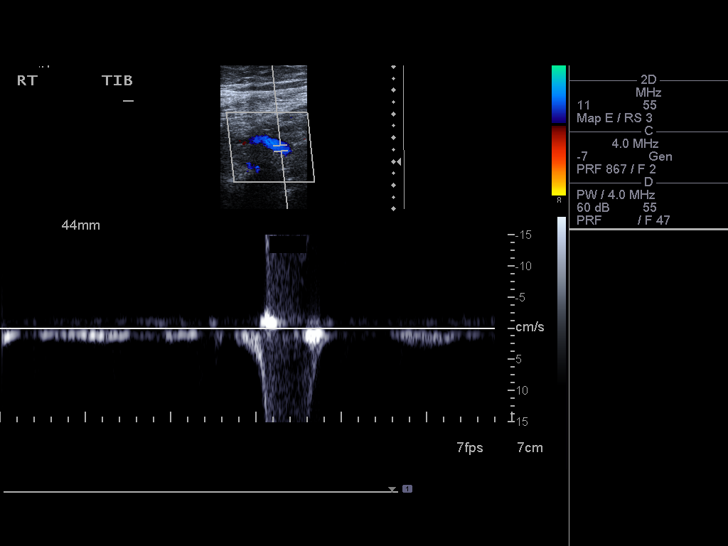
[im 23/34]
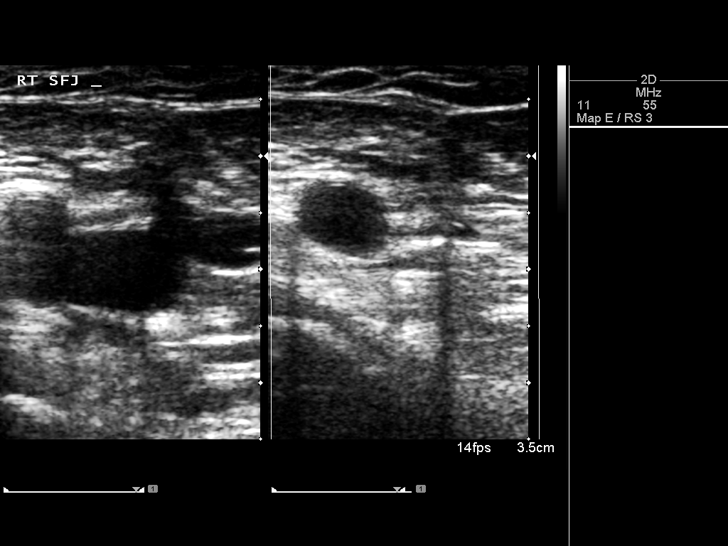
[im 26/34]
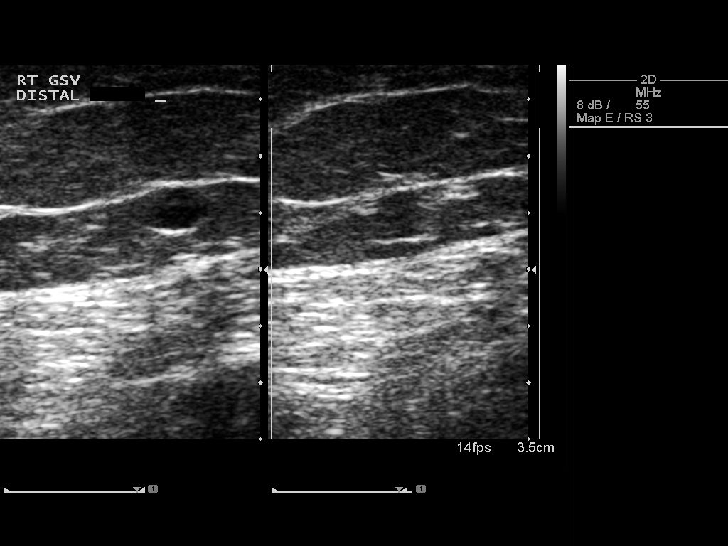
[im 28/34]
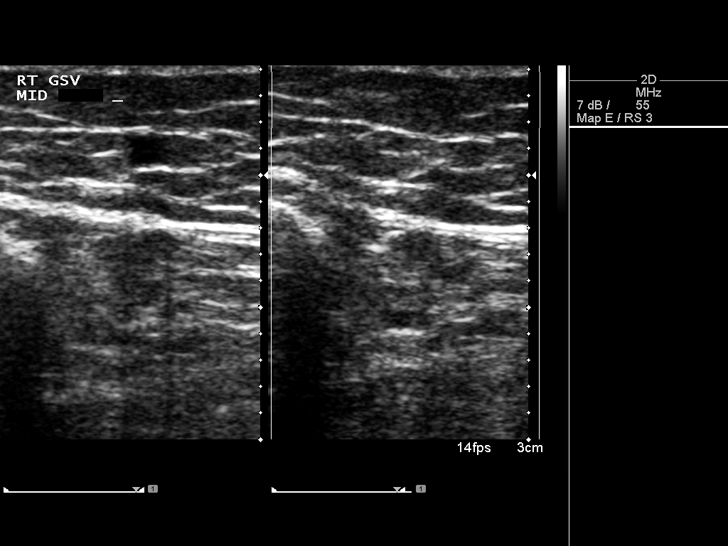
[im 31/34]
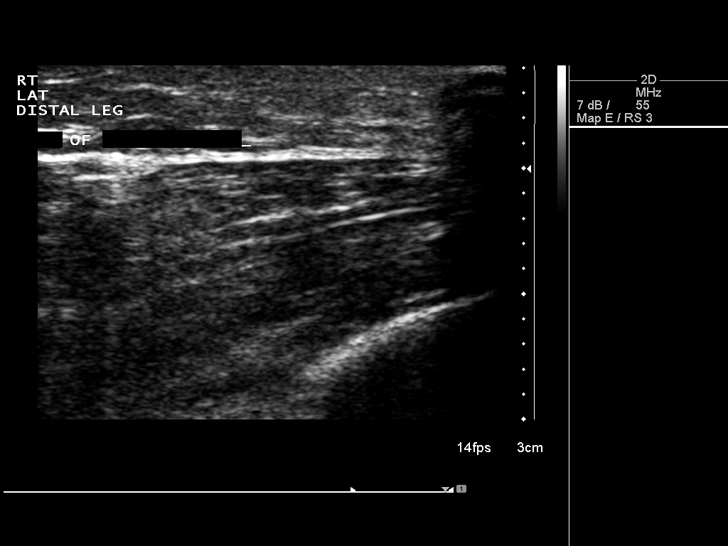
[im 34/34]
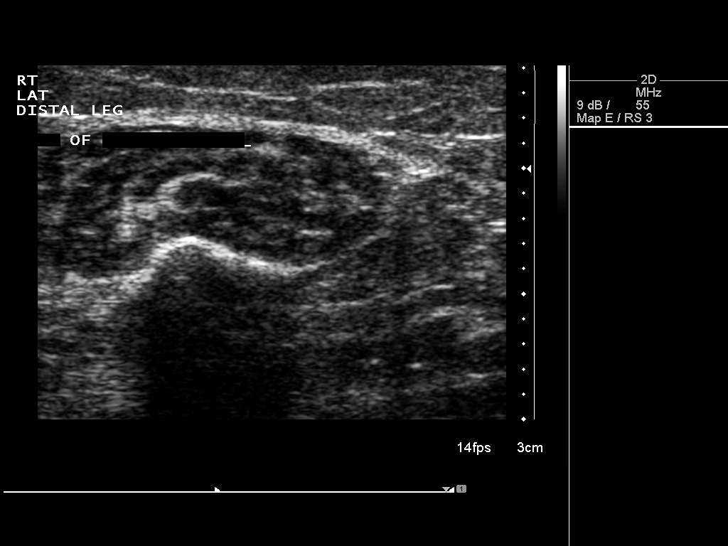

[14 of 24 positions shown; findings below may reference images not displayed]

FINDINGS: The visualized right lower extremity deep venous system
appears patent.

Normal compressibility.  Patent color Doppler flow.  Satisfactory
spectral Doppler with respiratory variation and response to
augmentation.

The greater saphenous vein, where visualized, is patent and
compressible.

The patient reported pain along the distal lateral calf.  Targeted
ultrasound of this area demonstrated no sonographic abnormality.
IMPRESSION: No deep venous thrombosis in the visualized right lower extremity.

## 2014-02-05 ENCOUNTER — Ambulatory Visit: Payer: BC Managed Care – PPO | Admitting: Cardiology

## 2014-03-11 ENCOUNTER — Ambulatory Visit (INDEPENDENT_AMBULATORY_CARE_PROVIDER_SITE_OTHER): Payer: BC Managed Care – PPO | Admitting: Cardiology

## 2014-03-11 VITALS — BP 122/80 | HR 60 | Ht 63.0 in | Wt 136.0 lb

## 2014-03-11 DIAGNOSIS — I10 Essential (primary) hypertension: Secondary | ICD-10-CM

## 2014-03-11 DIAGNOSIS — I059 Rheumatic mitral valve disease, unspecified: Secondary | ICD-10-CM

## 2014-03-11 DIAGNOSIS — I493 Ventricular premature depolarization: Secondary | ICD-10-CM

## 2014-03-11 DIAGNOSIS — I4949 Other premature depolarization: Secondary | ICD-10-CM

## 2014-03-11 DIAGNOSIS — I491 Atrial premature depolarization: Secondary | ICD-10-CM

## 2014-03-11 DIAGNOSIS — I340 Nonrheumatic mitral (valve) insufficiency: Secondary | ICD-10-CM

## 2014-03-11 NOTE — Progress Notes (Signed)
  [address] [place], [place]  [address] Phone: [phone] Fax:  [phone]  Date:  03/11/2014   ID:  Alicia, Irwin 1961/02/28, MRN [id number]  PCP:  Katy Apo, MD  Cardiologist:  Armanda Magic, MD     History of Present Illness: Alicia Irwin is a 53 y.o. female with a history of PAC's, PVC's and HTN who presents today for followup. She is doing well. She denies any chest pain, SOB, LE edema, palpitations or syncope. She did not tolerate Bystolic in the past and decided not to try metoprolol.  She says that recently she has noticed a lump in her neck that comes and goes after having a tick bite.  She was treated with antibiotics by Dr. Nehemiah Settle.  She has continued to have intermittent swelling of the lump in her neck and then started having joint pain in her arms bilaterally and she is having a workup by Dr. Nehemiah Settle.     Wt Readings from Last 3 Encounters:  03/11/14 136 lb (61.689 kg)  05/23/13 125 lb (56.7 kg)     Past Medical History  Diagnosis Date  . Cervicalgia   . Insomnia   . Anxiety   . HTN (hypertension)   . Migraine headache   . Hypercholesteremia   . Mitral regurgitation     mild to moderate by echo 07/2012  . Other premature beats     PVC's/PAC's intolerant to bystolic, not taking metoprolol  . PVC's (premature ventricular contractions)   . PAC (premature atrial contraction)     Current Outpatient Prescriptions  Medication Sig Dispense Refill  . CLIMARA 0.06 MG/24HR Place 1 patch onto the skin once a week.        No current facility-administered medications for this visit.    Allergies:   No Known Allergies  Social History:  The patient  reports that she has never smoked. She does not have any smokeless tobacco history on file. She reports that she drinks alcohol. She reports that she does not use illicit drugs.   Family History:  The patient's family history includes Aortic aneurysm in her mother; Hypertension in her father.   ROS:   Please see the history of present illness.      All other systems reviewed and negative.   PHYSICAL EXAM: VS:  BP 122/80  Pulse 60  Ht  (1.6 m)  Wt 136 lb (61.689 kg)  BMI 24.10 kg/m2 Well nourished, well developed, in no acute distress HEENT: normal Neck: no JVD Cardiac:  normal S1, S2; RRR; no murmur Lungs:  clear to auscultation bilaterally, no wheezing, rhonchi or rales Abd: soft, nontender, no hepatomegaly Ext: no edema Skin: warm and dry Neuro:  CNs 2-12 intact, no focal abnormalities noted     ASSESSMENT AND PLAN:  1. PAC's and PVC's - asymptomatic 2. HTN - well controlled  Followup with me in 6 months   Signed, Armanda Magic, MD 03/11/2014 9:44 AM

## 2014-03-11 NOTE — Patient Instructions (Signed)
Your physician recommends that you continue on your current medications as directed. Please refer to the Current Medication list given to you today.  Your physician wants you to follow-up in: 1 year with Dr. Turner. You will receive a reminder letter in the mail two months in advance. If you don't receive a letter, please call our office to schedule the follow-up appointment.  

## 2014-03-13 ENCOUNTER — Ambulatory Visit: Payer: BC Managed Care – PPO | Admitting: Cardiology

## 2014-06-29 ENCOUNTER — Other Ambulatory Visit: Payer: Self-pay | Admitting: Internal Medicine

## 2014-06-29 DIAGNOSIS — R591 Generalized enlarged lymph nodes: Secondary | ICD-10-CM

## 2014-06-29 DIAGNOSIS — R599 Enlarged lymph nodes, unspecified: Secondary | ICD-10-CM

## 2014-07-01 ENCOUNTER — Other Ambulatory Visit: Payer: BC Managed Care – PPO

## 2014-07-13 ENCOUNTER — Other Ambulatory Visit: Payer: Self-pay | Admitting: Internal Medicine

## 2014-07-13 ENCOUNTER — Ambulatory Visit
Admission: RE | Admit: 2014-07-13 | Discharge: 2014-07-13 | Disposition: A | Discharge status: Home / Self Care | Payer: BLUE CROSS/BLUE SHIELD | Source: Ambulatory Visit | Attending: Internal Medicine | Admitting: Internal Medicine

## 2014-07-13 DIAGNOSIS — R591 Generalized enlarged lymph nodes: Secondary | ICD-10-CM

## 2014-07-13 DIAGNOSIS — R599 Enlarged lymph nodes, unspecified: Secondary | ICD-10-CM

## 2014-07-15 ENCOUNTER — Other Ambulatory Visit: Payer: No Typology Code available for payment source

## 2014-07-17 ENCOUNTER — Ambulatory Visit
Admission: RE | Admit: 2014-07-17 | Discharge: 2014-07-17 | Disposition: A | Discharge status: Home / Self Care | Payer: BLUE CROSS/BLUE SHIELD | Source: Ambulatory Visit | Attending: Internal Medicine | Admitting: Internal Medicine

## 2014-07-17 DIAGNOSIS — R599 Enlarged lymph nodes, unspecified: Secondary | ICD-10-CM

## 2014-07-17 DIAGNOSIS — R591 Generalized enlarged lymph nodes: Secondary | ICD-10-CM

## 2014-07-17 MED ORDER — IOHEXOL 300 MG/ML  SOLN
75.0000 mL | Freq: Once | INTRAMUSCULAR | Status: AC | PRN
  Administered 2014-07-17: 75 mL via INTRAVENOUS

## 2014-12-16 ENCOUNTER — Encounter: Payer: Self-pay | Admitting: Cardiology

## 2015-03-31 ENCOUNTER — Ambulatory Visit (INDEPENDENT_AMBULATORY_CARE_PROVIDER_SITE_OTHER): Payer: BLUE CROSS/BLUE SHIELD | Admitting: Cardiology

## 2015-03-31 ENCOUNTER — Encounter: Payer: Self-pay | Admitting: Cardiology

## 2015-03-31 VITALS — BP 143/94 | HR 61 | Ht 63.0 in | Wt 137.8 lb

## 2015-03-31 DIAGNOSIS — I493 Ventricular premature depolarization: Secondary | ICD-10-CM

## 2015-03-31 DIAGNOSIS — I491 Atrial premature depolarization: Secondary | ICD-10-CM | POA: Diagnosis not present

## 2015-03-31 DIAGNOSIS — I10 Essential (primary) hypertension: Secondary | ICD-10-CM

## 2015-03-31 MED ORDER — AMLODIPINE BESYLATE 2.5 MG PO TABS: 2.5000 mg | ORAL_TABLET | Freq: Every day | ORAL | Status: DC

## 2015-03-31 NOTE — Progress Notes (Signed)
Cardiology Office Note   Date:  03/31/2015   ID:  Alicia Irwin, DOB 02-27-1961, MRN [id number]  PCP:  Katy Apo, MD    Chief Complaint  Patient presents with  . PCV's      History of Present Illness: Alicia Irwin is a 54 y.o. female with a history of PAC's, PVC's and HTN who presents today for followup. She is doing well. She denies any chest pain, SOB, LE edema, LE edema or syncope. Rarely she will have some palpitations but very well tolerated. She did not tolerate Bystolic in the past and decided not to try metoprolol. Her BP is elevated today but she says that she has a mild migraine HA.     Past Medical History  Diagnosis Date  . Cervicalgia   . Insomnia   . Anxiety   . HTN (hypertension)   . Migraine headache   . Hypercholesteremia   . Mitral regurgitation     mild to moderate by echo 07/2012  . Other premature beats     PVC's/PAC's intolerant to bystolic, not taking metoprolol  . PVC's (premature ventricular contractions)   . PAC (premature atrial contraction)     Past Surgical History  Procedure Laterality Date  . Tubal ligation       Current Outpatient Prescriptions  Medication Sig Dispense Refill  . CLIMARA 0.06 MG/24HR Place 1 patch onto the skin once a week.      No current facility-administered medications for this visit.    Allergies:   Contrast media    Social History:  The patient  reports that she has never smoked. She does not have any smokeless tobacco history on file. She reports that she drinks alcohol. She reports that she does not use illicit drugs.   Family History:  The patient's family history includes Aortic aneurysm in her mother; Hypertension in her father.    ROS:  Please see the history of present illness.   Otherwise, review of systems are positive for none.   All other systems are reviewed and negative.    PHYSICAL EXAM: VS:  BP 156/98 mmHg  Pulse 61  Ht  (1.6 m)  Wt 137 lb 12.8 oz (62.506  kg)  BMI 24.42 kg/m2 , BMI Body mass index is 24.42 kg/(m^2). GEN: Well nourished, well developed, in no acute distress HEENT: normal Neck: no JVD, carotid bruits, or masses Cardiac: RRR; no murmurs, rubs, or gallops,no edema  Respiratory:  clear to auscultation bilaterally, normal work of breathing GI: soft, nontender, nondistended, + BS MS: no deformity or atrophy Skin: warm and dry, no rash Neuro:  Strength and sensation are intact Psych: euthymic mood, full affect   EKG:  EKG is ordered today. The ekg ordered today demonstrates NSR at 61bpm with no ST changes and IRBBB   Recent Labs: No results found for requested labs within last 365 days.    Lipid Panel No results found for: CHOL, TRIG, HDL, CHOLHDL, VLDL, LDLCALC, LDLDIRECT    Wt Readings from Last 3 Encounters:  03/31/15 137 lb 12.8 oz (62.506 kg)  03/11/14 136 lb (61.689 kg)  05/23/13 125 lb (56.7 kg)    ASSESSMENT AND PLAN:  1. PAC's and PVC's - asymptomatic 2. HTN - elevated on exam today. I will add low dose amlodipine 2.5mg  daily and have her seen in HTN clinic in 3 weeks    Current medicines  are reviewed at length with the patient today.  The patient does not have concerns regarding medicines.  The following changes have been made:  no change  Labs/ tests ordered today: See above Assessment and Plan No orders of the defined types were placed in this encounter.     Disposition:   FU with me in 1 year  Signed, Quintella Reichert, MD  03/31/2015 2:37 PM    [facility] [address] [place], [place], [place]  [address] Phone: [phone]; Fax: [phone]

## 2015-03-31 NOTE — Patient Instructions (Addendum)
Medication Instructions:  Your physician has recommended you make the following change in your medication:  1) START AMLODIPINE 2.5 mg daily  Labwork: None  Testing/Procedures: None  Follow-Up: Your physician recommends that you schedule a follow-up appointment with Tereso Newcomer on 04/28/16 at 1150.  Your physician wants you to follow-up in: 1 year with Dr. Mayford Knife. You will receive a reminder letter in the mail two months in advance. If you don't receive a letter, please call our office to schedule the follow-up appointment.   Any Other Special Instructions Will Be Listed Below (If Applicable).

## 2015-04-28 NOTE — Progress Notes (Signed)
Cardiology Office Note   Date:  04/29/2015   ID:  Alicia Irwin, DOB [birth year], MRN [id number]  PCP:  Katy Apo, MD  Cardiologist:  Dr. Armanda Magic   Electrophysiologist:  n/a  Chief Complaint  Patient presents with  . Follow-up    HTN     History of Present Illness: Alicia Irwin is a 54 y.o. female with a hx of PACs/PVCs, HTN.  She has not tolerated Bystolic for her palpitations and decided not to take Metoprolol.  Last seen by Dr. Armanda Magic 03/31/15.  BP was uncontrolled.  She was placed on Amlodipine and returns for FU.  She is doing ok.  She has had some stressful event recently and feels anxious. She felt like she could tell her BP was running higher. She is trying to exercise to lose weight.  She denies chest pain, dyspnea, orthopnea, PND, edema.  She denies syncope.    Studies/Reports Reviewed Today:  ETT 1/14 Negative  Echo 1/14 EF 68.5%, mild to mod MR, mild TR, trace PI   Past Medical History  Diagnosis Date  . Cervicalgia   . Insomnia   . Anxiety   . HTN (hypertension)   . Migraine headache   . Hypercholesteremia   . Mitral regurgitation     mild to moderate by echo 07/2012  . Other premature beats     PVC's/PAC's intolerant to bystolic, not taking metoprolol  . PVC's (premature ventricular contractions)   . PAC (premature atrial contraction)     Past Surgical History  Procedure Laterality Date  . Tubal ligation       Current Outpatient Prescriptions  Medication Sig Dispense Refill  . amLODipine (NORVASC) 5 MG tablet Take 1 tablet (5 mg total) by mouth daily. 30 tablet 11  . CLIMARA 0.06 MG/24HR Place 1 patch onto the skin once a week.      No current facility-administered medications for this visit.    Allergies:   Contrast media    Social History:  The patient  reports that she has never smoked. She does not have any smokeless tobacco history on file. She reports that she drinks alcohol. She reports that she does not use illicit  drugs.   Family History:  The patient's family history includes Aortic aneurysm in her mother; Hypertension in her father.    ROS:   Please see the history of present illness.   Review of Systems  All other systems reviewed and are negative.     PHYSICAL EXAM: VS:  BP 142/70 mmHg  Pulse 67  Ht 5' 3.5" (1.613 m)  Wt 135 lb 1.9 oz (61.29 kg)  BMI 23.56 kg/m2    Wt Readings from Last 3 Encounters:  04/29/15 135 lb 1.9 oz (61.29 kg)  03/31/15 137 lb 12.8 oz (62.506 kg)  03/11/14 136 lb (61.689 kg)     GEN: Well nourished, well developed, in no acute distress HEENT: normal Neck: no JVD, no carotid bruits, no masses Cardiac:  Normal S1/S2, RRR; no murmur ,  no rubs or gallops, no edema   Respiratory:  clear to auscultation bilaterally, no wheezing, rhonchi or rales. GI: soft, nontender, nondistended, + BS MS: no deformity or atrophy Skin: warm and dry  Neuro:  CNs II-XII intact, Strength and sensation are intact Psych: Normal affect   EKG:  EKG is ordered today.  It demonstrates:   NSR, HR 67, normal axis, inc RBBB, no change from prior tracing, QTc 431 ms.   Recent  Labs: No results found for requested labs within last 365 days.    Lipid Panel No results found for: CHOL, TRIG, HDL, CHOLHDL, VLDL, LDLCALC, LDLDIRECT    ASSESSMENT AND PLAN:  1. HTN:  BP is improved on low dose Amlodipine but still above target.  Increase Amlodipine to 5 mg QD.  She is getting annual labs with her PCP later today for CPE.  I have asked her to get a BP machine and to check this a few times a week.  She will call us with the readings.  Adjust medication further if BP remains above target.   2. Anxiety:  Discuss with PCP further. Anxiolytic therapy may be of some benefit.     Medication Changes: Current medicines are reviewed at length with the patient today.  Concerns regarding medicines are as outlined above.  The following changes have been made:   Discontinued Medications   No  medications on file   Modified Medications   Modified Medication Previous Medication   AMLODIPINE (NORVASC) 5 MG TABLET amLODipine (NORVASC) 2.5 MG tablet      Take 1 tablet (5 mg total) by mouth daily.    Take 1 tablet (2.5 mg total) by mouth daily.   New Prescriptions   No medications on file   Labs/ tests ordered today include:   Orders Placed This Encounter  Procedures  . EKG 12-Lead      Disposition:    FU with Dr. Armanda Magicraci Turner prn.  She will call with BP readings in about 1 month.     Signed, Brynda RimScott Weaver, PA-C, MHS 04/29/2015 2:57 PM    [facility] [address], [place], [place]  [address] Phone: [phone](336) [phone]; Fax: [phone]

## 2015-04-29 ENCOUNTER — Ambulatory Visit: Payer: Self-pay | Admitting: Physician Assistant

## 2015-04-29 ENCOUNTER — Ambulatory Visit (INDEPENDENT_AMBULATORY_CARE_PROVIDER_SITE_OTHER): Payer: BLUE CROSS/BLUE SHIELD | Admitting: Physician Assistant

## 2015-04-29 ENCOUNTER — Encounter: Payer: Self-pay | Admitting: Physician Assistant

## 2015-04-29 VITALS — BP 142/70 | HR 67 | Ht 63.5 in | Wt 135.1 lb

## 2015-04-29 DIAGNOSIS — F419 Anxiety disorder, unspecified: Secondary | ICD-10-CM

## 2015-04-29 DIAGNOSIS — I10 Essential (primary) hypertension: Secondary | ICD-10-CM

## 2015-04-29 MED ORDER — AMLODIPINE BESYLATE 5 MG PO TABS: 5.0000 mg | ORAL_TABLET | Freq: Every day | ORAL | Status: DC

## 2015-04-29 NOTE — Patient Instructions (Signed)
Medication Instructions:  1. INCREASE NORVASC TO 5 MG DAILY; NEW RX SENT IN  Labwork: NONE  Testing/Procedures: NONE  Follow-Up: KEEP YOUR FOLLOW UP WITH DR. Mayford KnifeURNER AS PLANNED  Any Other Special Instructions Will Be Listed Below (If Applicable). GET A NEW BP MACHINE AND CHECK BP 3-4 TIMES A WEEK AND CALL WITH READINGS IN 1 MONTH [phone]

## 2015-11-19 DIAGNOSIS — D2262 Melanocytic nevi of left upper limb, including shoulder: Secondary | ICD-10-CM | POA: Diagnosis not present

## 2015-11-19 DIAGNOSIS — D2261 Melanocytic nevi of right upper limb, including shoulder: Secondary | ICD-10-CM | POA: Diagnosis not present

## 2015-11-19 DIAGNOSIS — D2271 Melanocytic nevi of right lower limb, including hip: Secondary | ICD-10-CM | POA: Diagnosis not present

## 2015-11-19 DIAGNOSIS — D2230 Melanocytic nevi of unspecified part of face: Secondary | ICD-10-CM | POA: Diagnosis not present

## 2016-02-09 DIAGNOSIS — Z1231 Encounter for screening mammogram for malignant neoplasm of breast: Secondary | ICD-10-CM | POA: Diagnosis not present

## 2016-02-09 DIAGNOSIS — Z01419 Encounter for gynecological examination (general) (routine) without abnormal findings: Secondary | ICD-10-CM | POA: Diagnosis not present

## 2016-02-09 DIAGNOSIS — Z6824 Body mass index (BMI) 24.0-24.9, adult: Secondary | ICD-10-CM | POA: Diagnosis not present

## 2016-03-17 ENCOUNTER — Encounter: Payer: Self-pay | Admitting: Nurse Practitioner

## 2016-04-03 ENCOUNTER — Encounter (INDEPENDENT_AMBULATORY_CARE_PROVIDER_SITE_OTHER): Payer: Self-pay

## 2016-04-03 ENCOUNTER — Ambulatory Visit (INDEPENDENT_AMBULATORY_CARE_PROVIDER_SITE_OTHER): Payer: BLUE CROSS/BLUE SHIELD | Admitting: Nurse Practitioner

## 2016-04-03 ENCOUNTER — Encounter: Payer: Self-pay | Admitting: Nurse Practitioner

## 2016-04-03 VITALS — BP 124/82 | HR 78 | Ht 63.5 in | Wt 139.0 lb

## 2016-04-03 DIAGNOSIS — I10 Essential (primary) hypertension: Secondary | ICD-10-CM

## 2016-04-03 DIAGNOSIS — I493 Ventricular premature depolarization: Secondary | ICD-10-CM | POA: Diagnosis not present

## 2016-04-03 DIAGNOSIS — I491 Atrial premature depolarization: Secondary | ICD-10-CM | POA: Diagnosis not present

## 2016-04-03 DIAGNOSIS — F419 Anxiety disorder, unspecified: Secondary | ICD-10-CM

## 2016-04-03 MED ORDER — AMLODIPINE BESYLATE 5 MG PO TABS: 5.0000 mg | ORAL_TABLET | Freq: Every day | ORAL | 3 refills | Status: DC

## 2016-04-03 NOTE — Progress Notes (Signed)
CARDIOLOGY OFFICE NOTE  Date:  04/03/2016    Alicia Irwin Date of Birth: 1961-06-08 Medical Record #[id number]  PCP:  Katy Apo, MD  Cardiologist:  Mayford Knife  Chief Complaint  Patient presents with  . Irregular Heart Beat  . Hypertension    1 year check - seen for Dr. Mayford Knife    History of Present Illness: Alicia Irwin is a 55 y.o. female who presents today for an approximate one year check. Seen for Dr. Mayford Knife.   She has a hx of PACs/PVCs, HTN.  She has not tolerated Bystolic for her palpitations and decided not to take Metoprolol.  Last seen by Dr. Armanda Magic 03/31/15.  BP was uncontrolled.  She was placed on Amlodipine.  Last seen by Tereso Newcomer, PA back in October and she was felt to be doing ok.   Comes in today. Here alone. She notes she is doing well. Says she is not under as much stress - has had significant stress in the past - her sons were murdered back in 2011. 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 No chest pain. No real palpitations. Needs her medicine refilled today. Does admit she has gotten off track with diet/exercise.   Past Medical History:  Diagnosis Date  . Anxiety   . Cervicalgia   . HTN (hypertension)   . Hypercholesteremia   . Insomnia   . Migraine headache   . Mitral regurgitation    mild to moderate by echo 07/2012  . Other premature beats    PVC's/PAC's intolerant to bystolic, not taking metoprolol  . PAC (premature atrial contraction)   . PVC's (premature ventricular contractions)     Past Surgical History:  Procedure Laterality Date  . TUBAL LIGATION       Medications: Current Outpatient Prescriptions  Medication Sig Dispense Refill  . amLODipine (NORVASC) 5 MG tablet Take 1 tablet (5 mg total) by mouth daily. 90 tablet 3  . CLIMARA 0.06 MG/24HR Place 1 patch onto the skin once a week.     Marland Kitchen ibuprofen (ADVIL,MOTRIN) 200 MG tablet Take 200 mg by mouth as  needed for fever, headache or mild pain.     No current facility-administered medications for this visit.     Allergies: Allergies  Allergen Reactions  . Contrast Media [Iodinated Diagnostic Agents] Hives and Rash    Patient to get pre meds before ct scans     Social History: The patient  reports that she has never smoked. She has never used smokeless tobacco. She reports that she drinks alcohol. She reports that she does not use drugs.   Family History: The patient's family history includes Aortic aneurysm in her mother; Hypertension in her father.   Review of Systems: Please see the history of present illness.   Otherwise, the review of systems is positive for none.   All other systems are reviewed and negative.   Physical Exam: VS:  BP 124/82   Pulse 78   Ht 5' 3.5" (1.613 m)   Wt 139 lb (63 kg)   BMI 24.24 kg/m  .  BMI Body mass index is 24.24 kg/m.  Wt Readings from Last 3 Encounters:  04/03/16 139 lb (63 kg)  04/29/15 135 lb 1.9 oz (61.3 kg)  03/31/15 137 lb 12.8 oz (62.5 kg)    General: Pleasant. Well developed, well nourished and in no acute distress.   HEENT:  Normal.  Neck: Supple, no JVD, carotid bruits, or masses noted.  Cardiac: Regular rate and rhythm. No murmurs, rubs, or gallops. No edema.  Respiratory:  Lungs are clear to auscultation bilaterally with normal work of breathing.  GI: Soft and nontender.  MS: No deformity or atrophy. Gait and ROM intact.  Skin: Warm and dry. Color is normal.  Neuro:  Strength and sensation are intact and no gross focal deficits noted.  Psych: Alert, appropriate and with normal affect.   LABORATORY DATA:  EKG:  EKG is ordered today. This demonstrates NSR with RSR'.  Lab Results  Component Value Date   WBC 7.4 08/10/2008   HGB 13.8 08/10/2008   HCT 40.0 08/10/2008   PLT 269 08/10/2008   GLUCOSE 88 05/23/2013   ALT 12 08/10/2008   AST 27 08/10/2008   NA 137 05/23/2013   K 3.7 05/23/2013   CL 103 05/23/2013    CREATININE 0.7 05/23/2013   BUN 18 05/23/2013   CO2 29 05/23/2013    BNP (last 3 results) No results for input(s): BNP in the last 8760 hours.  ProBNP (last 3 results) No results for input(s): PROBNP in the last 8760 hours.   Other Studies Reviewed Today:   Assessment/Plan: 1. HTN:  BP looks great on her current regimen. Norvasc refilled today.   2. Anxiety:  Does not really seem to be an issue today.   3. Palpitations - seems resolved at this time.   Current medicines are reviewed with the patient today.  The patient does not have concerns regarding medicines other than what has been noted above.  The following changes have been made:  See above.  Labs/ tests ordered today include:    Orders Placed This Encounter  Procedures  . EKG 12-Lead     Disposition:   FU with us in one year. I will be happy to see her back.   Patient is agreeable to this plan and will call if any problems develop in the interim.   Signed: Rosalio MacadamiaLori C. Alford Gamero, RN, ANP-C 04/03/2016 10:51 AM  [facility] [address] [place], [place]  [address] Phone: [phone](336) [phone] Fax: [phone]

## 2016-04-03 NOTE — Patient Instructions (Addendum)
We will be checking the following labs today - NONE  Ask Dr. Nehemiah SettlePolite to send us a copy of your labs at your physical next month.    Medication Instructions:    Continue with your current medicines.   I refilled the Norvasc today    Testing/Procedures To Be Arranged:  N/A  Follow-Up:   See us back in one year    Other Special Instructions:   Think about what we talked about today    If you need a refill on your cardiac medications before your next appointment, please call your pharmacy.   Call the [facility] office at [phone] if you have any questions, problems or concerns.

## 2016-04-19 DIAGNOSIS — L570 Actinic keratosis: Secondary | ICD-10-CM | POA: Diagnosis not present

## 2016-04-27 ENCOUNTER — Other Ambulatory Visit: Payer: Self-pay | Admitting: Physician Assistant

## 2016-04-27 DIAGNOSIS — I10 Essential (primary) hypertension: Secondary | ICD-10-CM

## 2016-04-27 NOTE — Telephone Encounter (Signed)
amLODipine (NORVASC) 5 MG tablet  Medication  Date: 04/03/2016 Department: [facility] [address] Office Ordering/Authorizing: Rosalio MacadamiaLori C Gerhardt, NP  Order Providers   Prescribing Provider Encounter Provider  Rosalio MacadamiaLori C Gerhardt, NP Rosalio MacadamiaLori C Gerhardt, NP  Supervision Information   Supervising Provider Type of Supervision  Peter M Swazil[place]Jordan, MD Incident To  Medication Detail    Disp Refills Start End   amLODipine (NORVASC) 5 MG tablet 90 tablet 3 04/03/2016    Sig - Route: Take 1 tablet (5 mg total) by mouth daily. - Oral   E-Prescribing Status: Receipt confirmed by pharmacy (04/03/2016 10:51 AM EDT)   Associated Diagnoses   Essential hypertension - Primary     Pharmacy   [id number] - [place], [place] - [address] [facility] [address]

## 2016-05-16 DIAGNOSIS — Z0000 Encounter for general adult medical examination without abnormal findings: Secondary | ICD-10-CM | POA: Diagnosis not present

## 2016-12-12 DIAGNOSIS — L821 Other seborrheic keratosis: Secondary | ICD-10-CM | POA: Diagnosis not present

## 2016-12-12 DIAGNOSIS — L738 Other specified follicular disorders: Secondary | ICD-10-CM | POA: Diagnosis not present

## 2016-12-12 DIAGNOSIS — D2261 Melanocytic nevi of right upper limb, including shoulder: Secondary | ICD-10-CM | POA: Diagnosis not present

## 2016-12-12 DIAGNOSIS — L570 Actinic keratosis: Secondary | ICD-10-CM | POA: Diagnosis not present

## 2016-12-12 DIAGNOSIS — B078 Other viral warts: Secondary | ICD-10-CM | POA: Diagnosis not present

## 2017-02-26 ENCOUNTER — Ambulatory Visit (INDEPENDENT_AMBULATORY_CARE_PROVIDER_SITE_OTHER): Payer: BLUE CROSS/BLUE SHIELD | Admitting: Obstetrics & Gynecology

## 2017-02-26 ENCOUNTER — Encounter: Payer: Self-pay | Admitting: Obstetrics & Gynecology

## 2017-02-26 VITALS — BP 134/90 | Ht 63.0 in | Wt 141.0 lb

## 2017-02-26 DIAGNOSIS — Z01411 Encounter for gynecological examination (general) (routine) with abnormal findings: Secondary | ICD-10-CM | POA: Diagnosis not present

## 2017-02-26 DIAGNOSIS — N393 Stress incontinence (female) (male): Secondary | ICD-10-CM

## 2017-02-26 DIAGNOSIS — N951 Menopausal and female climacteric states: Secondary | ICD-10-CM

## 2017-02-26 DIAGNOSIS — Z780 Asymptomatic menopausal state: Secondary | ICD-10-CM

## 2017-02-26 DIAGNOSIS — N760 Acute vaginitis: Secondary | ICD-10-CM | POA: Diagnosis not present

## 2017-02-26 DIAGNOSIS — Z1151 Encounter for screening for human papillomavirus (HPV): Secondary | ICD-10-CM | POA: Diagnosis not present

## 2017-02-26 DIAGNOSIS — Z90710 Acquired absence of both cervix and uterus: Secondary | ICD-10-CM

## 2017-02-26 MED ORDER — ESTRADIOL 0.0375 MG/24HR TD PTWK: 0.0375 mg | MEDICATED_PATCH | TRANSDERMAL | 4 refills | Status: DC

## 2017-02-26 NOTE — Progress Notes (Signed)
Alicia Irwin Dec 23, 1960 [id number]   History:    56 y.o. G2P2L1 Married.  Traveling with husband who is a Optician, dispensing for [employer]  RP:  Established patient presenting for annual gyn exam   HPI:  Menopause.  D/ced Estradiol patch 0.06 last year and severe hot flashes/night sweats since then with decreased sleep and tiredness.  S/P Hysterectomy.  Breasts wnl.  Screening Mammo neg 02/2016.  C/O SUI, not doing Kegel exercises.  BMs wnl.  Past medical history,surgical history, family history and social history were all reviewed and documented in the EPIC chart.  Gynecologic History No LMP recorded. Patient has had a hysterectomy. Contraception: post menopausal status Last Pap: 2016. Results were: normal Last mammogram: 02/2016. Results were: normal BD 2014 [place] 2012  Obstetric History OB History  Gravida Para Term Preterm AB Living  2 2       1   SAB TAB Ectopic Multiple Live Births               # Outcome Date GA Lbr Len/2nd Weight Sex Delivery Anes PTL Lv  2 Para           1 Para                ROS: A ROS was performed and pertinent positives and negatives are included in the history.  GENERAL: No fevers or chills. HEENT: No change in vision, no earache, sore throat or sinus congestion. NECK: No pain or stiffness. CARDIOVASCULAR: No chest pain or pressure. No palpitations. PULMONARY: No shortness of breath, cough or wheeze. GASTROINTESTINAL: No abdominal pain, nausea, vomiting or diarrhea, melena or bright red blood per rectum. GENITOURINARY: No urinary frequency, urgency, hesitancy or dysuria. MUSCULOSKELETAL: No joint or muscle pain, no back pain, no recent trauma. DERMATOLOGIC: No rash, no itching, no lesions. ENDOCRINE: No polyuria, polydipsia, no heat or cold intolerance. No recent change in weight. HEMATOLOGICAL: No anemia or easy bruising or bleeding. NEUROLOGIC: No headache, seizures, numbness, tingling or weakness. PSYCHIATRIC: No depression, no loss of interest in normal  activity or change in sleep pattern.     Exam:   BP 134/90   Ht 5\' 3"  (1.6 m)   Wt 141 lb (64 kg)   BMI 24.98 kg/m   Body mass index is 24.98 kg/m.  General appearance : Well developed well nourished female. No acute distress HEENT: Eyes: no retinal hemorrhage or exudates,  Neck supple, trachea midline, no carotid bruits, no thyroidmegaly Lungs: Clear to auscultation, no rhonchi or wheezes, or rib retractions  Heart: Regular rate and rhythm, no murmurs or gallops Breast:Examined in sitting and supine position were symmetrical in appearance, no palpable masses or tenderness,  no skin retraction, no nipple inversion, no nipple discharge, no skin discoloration, no axillary or supraclavicular lymphadenopathy Abdomen: no palpable masses or tenderness, no rebound or guarding Extremities: no edema or skin discoloration or tenderness  Pelvic: Vulva normal  Bartholin, Urethra, Skene Glands: Within normal limits             Vagina: No gross lesions or discharge.  Pap reflex done.  Cervix/Uterus Absent  Adnexa  Without masses or tenderness  Anus and perineum  normal     Assessment/Plan:  56 y.o. female for annual exam   1. Encounter for gynecological examination with abnormal finding Gyn exam s/p Hysterectomy.  Pap reflex done.  Breasts wnl.  Schedule screening Mammo.  2. H/O total hysterectomy  3. Menopause present Very symptomatic with hot flashes/night sweats x Estradiol  patch was d/ced.  Decision to restart on a lower dosage with an Estradiol 0.0375 patch qweek.  Usage/Risks/Benefits reviewed.  4. SUI (stress urinary incontinence, female) Moderate SUI.  Recommend starting Kegel exercises to strengthen the pelvic floor.  Instructions given.  Counseling on above issues >50% x 10 minutes.  Genia Del MD, 3:04 PM 02/26/2017

## 2017-02-27 LAB — PAP, TP IMAGING W/ HPV RNA, RFLX HPV TYPE 16,18/45: HPV mRNA, High Risk: NOT DETECTED

## 2017-03-04 NOTE — Patient Instructions (Signed)
1. Encounter for gynecological examination with abnormal finding Gyn exam s/p Hysterectomy.  Pap reflex done.  Breasts wnl.  Schedule screening Mammo.  2. H/O total hysterectomy  3. Menopause present Very symptomatic with hot flashes/night sweats x Estradiol patch was d/ced.  Decision to restart on a lower dosage with an Estradiol 0.0375 patch qweek.  Usage/Risks/Benefits reviewed.  4. SUI (stress urinary incontinence, female) Moderate SUI.  Recommend starting Kegel exercises to strengthen the pelvic floor.  Instructions given.  Alicia Irwin, it was a pleasure to see you today!  I will inform you of your results as soon as available.    Kegel Exercises Kegel exercises help strengthen the muscles that support the rectum, vagina, small intestine, bladder, and uterus. Doing Kegel exercises can help:  Improve bladder and bowel control.  Improve sexual response.  Reduce problems and discomfort during pregnancy.  Kegel exercises involve squeezing your pelvic floor muscles, which are the same muscles you squeeze when you try to stop the flow of urine. The exercises can be done while sitting, standing, or lying down, but it is best to vary your position. Phase 1 exercises 1. Squeeze your pelvic floor muscles tight. You should feel a tight lift in your rectal area. If you are a female, you should also feel a tightness in your vaginal area. Keep your stomach, buttocks, and legs relaxed. 2. Hold the muscles tight for up to 10 seconds. 3. Relax your muscles. Repeat this exercise 50 times a day or as many times as told by your health care provider. Continue to do this exercise for at least 4-6 weeks or for as long as told by your health care provider. This information is not intended to replace advice given to you by your health care provider. Make sure you discuss any questions you have with your health care provider. Document Released: 06/12/2012 Document Revised: 02/19/2016 Document Reviewed:  05/16/2015 Elsevier Interactive Patient Education  [employer].

## 2017-04-02 ENCOUNTER — Encounter: Payer: Self-pay | Admitting: Nurse Practitioner

## 2017-04-02 ENCOUNTER — Ambulatory Visit (INDEPENDENT_AMBULATORY_CARE_PROVIDER_SITE_OTHER): Payer: BLUE CROSS/BLUE SHIELD | Admitting: Nurse Practitioner

## 2017-04-02 VITALS — BP 139/80 | HR 63 | Ht 63.5 in | Wt 140.1 lb

## 2017-04-02 DIAGNOSIS — R079 Chest pain, unspecified: Secondary | ICD-10-CM

## 2017-04-02 DIAGNOSIS — I491 Atrial premature depolarization: Secondary | ICD-10-CM

## 2017-04-02 DIAGNOSIS — I10 Essential (primary) hypertension: Secondary | ICD-10-CM

## 2017-04-02 DIAGNOSIS — I493 Ventricular premature depolarization: Secondary | ICD-10-CM | POA: Diagnosis not present

## 2017-04-02 DIAGNOSIS — Z1231 Encounter for screening mammogram for malignant neoplasm of breast: Secondary | ICD-10-CM | POA: Diagnosis not present

## 2017-04-02 MED ORDER — AMLODIPINE BESYLATE 5 MG PO TABS: 5.0000 mg | ORAL_TABLET | Freq: Every day | ORAL | 3 refills | Status: DC
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
## 2017-04-02 NOTE — Patient Instructions (Addendum)
We will be checking the following labs today - NONE   Medication Instructions:    Continue with your current medicines.   I refilled your Norvasc today.     Testing/Procedures To Be Arranged:  GXT -any day of the week  Follow-Up:   See me in about 3 to 4 months - Monday, Tuesday or Wednesday only    Other Special Instructions:   Think about what we talked about today.    If you need a refill on your cardiac medications before your next appointment, please call your pharmacy.   Call the [facility] office at [phone] if you have any questions, problems or concerns.

## 2017-04-17 ENCOUNTER — Ambulatory Visit (INDEPENDENT_AMBULATORY_CARE_PROVIDER_SITE_OTHER): Payer: BLUE CROSS/BLUE SHIELD

## 2017-04-17 DIAGNOSIS — R079 Chest pain, unspecified: Secondary | ICD-10-CM | POA: Diagnosis not present

## 2017-04-17 DIAGNOSIS — I491 Atrial premature depolarization: Secondary | ICD-10-CM

## 2017-04-17 DIAGNOSIS — I493 Ventricular premature depolarization: Secondary | ICD-10-CM

## 2017-04-17 LAB — EXERCISE TOLERANCE TEST
Estimated workload: 8.5 METS
Exercise duration (min): 7 min
Exercise duration (sec): 0 s
MPHR: 164 {beats}/min
Peak HR: 166 {beats}/min
Percent HR: 101 %
RPE: 16
Rest HR: 80 {beats}/min

## 2017-05-22 DIAGNOSIS — Z13220 Encounter for screening for lipoid disorders: Secondary | ICD-10-CM | POA: Diagnosis not present

## 2017-05-22 DIAGNOSIS — Z0000 Encounter for general adult medical examination without abnormal findings: Secondary | ICD-10-CM | POA: Diagnosis not present

## 2017-06-20 ENCOUNTER — Encounter: Payer: Self-pay | Admitting: Nurse Practitioner

## 2017-06-20 ENCOUNTER — Telehealth: Payer: Self-pay | Admitting: *Deleted

## 2017-06-20 NOTE — Telephone Encounter (Signed)
Pt called in today to give bp readings but just random since October. BP readings as follows:  1. 152/93 2.  117/71 3.  137/86 4.  121/79  Pt is not having any issues or complaints and scheduled appt for next week.  [place]

## 2017-06-20 NOTE — Telephone Encounter (Signed)
Noted.  Would continue on current regimen.  See back as planned.

## 2017-06-26 ENCOUNTER — Ambulatory Visit: Payer: BLUE CROSS/BLUE SHIELD | Admitting: Nurse Practitioner

## 2017-06-26 ENCOUNTER — Encounter: Payer: Self-pay | Admitting: Nurse Practitioner

## 2017-06-26 VITALS — BP 132/84 | HR 74 | Ht 62.0 in | Wt 141.8 lb

## 2017-06-26 DIAGNOSIS — I493 Ventricular premature depolarization: Secondary | ICD-10-CM

## 2017-06-26 DIAGNOSIS — I491 Atrial premature depolarization: Secondary | ICD-10-CM

## 2017-06-26 DIAGNOSIS — I10 Essential (primary) hypertension: Secondary | ICD-10-CM

## 2017-06-26 NOTE — Patient Instructions (Addendum)
We will be checking the following labs today - NONE   Medication Instructions:    Continue with your current medicines.     Testing/Procedures To Be Arranged:  N/A  Follow-Up:   See me in one year.     Other Special Instructions:   Think about what we talked about today    If you need a refill on your cardiac medications before your next appointment, please call your pharmacy.   Call the [facility] office at [phone] if you have any questions, problems or concerns.
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
## 2017-10-26 DIAGNOSIS — L500 Allergic urticaria: Secondary | ICD-10-CM | POA: Diagnosis not present

## 2017-10-28 DIAGNOSIS — L500 Allergic urticaria: Secondary | ICD-10-CM | POA: Diagnosis not present

## 2017-11-30 DIAGNOSIS — E782 Mixed hyperlipidemia: Secondary | ICD-10-CM | POA: Diagnosis not present

## 2017-12-20 DIAGNOSIS — D2261 Melanocytic nevi of right upper limb, including shoulder: Secondary | ICD-10-CM | POA: Diagnosis not present

## 2017-12-20 DIAGNOSIS — D2271 Melanocytic nevi of right lower limb, including hip: Secondary | ICD-10-CM | POA: Diagnosis not present

## 2017-12-20 DIAGNOSIS — D2262 Melanocytic nevi of left upper limb, including shoulder: Secondary | ICD-10-CM | POA: Diagnosis not present

## 2017-12-20 DIAGNOSIS — D2230 Melanocytic nevi of unspecified part of face: Secondary | ICD-10-CM | POA: Diagnosis not present

## 2018-02-07 DIAGNOSIS — M8580 Other specified disorders of bone density and structure, unspecified site: Secondary | ICD-10-CM

## 2018-02-07 HISTORY — DX: Other specified disorders of bone density and structure, unspecified site: M85.80

## 2018-02-18 ENCOUNTER — Other Ambulatory Visit: Payer: Self-pay | Admitting: Nurse Practitioner

## 2018-02-18 DIAGNOSIS — I10 Essential (primary) hypertension: Secondary | ICD-10-CM

## 2018-02-27 ENCOUNTER — Telehealth: Payer: Self-pay | Admitting: *Deleted

## 2018-02-27 ENCOUNTER — Ambulatory Visit (INDEPENDENT_AMBULATORY_CARE_PROVIDER_SITE_OTHER): Payer: BLUE CROSS/BLUE SHIELD | Admitting: Obstetrics & Gynecology

## 2018-02-27 ENCOUNTER — Encounter: Payer: Self-pay | Admitting: Obstetrics & Gynecology

## 2018-02-27 VITALS — BP 132/88 | Ht 62.0 in | Wt 142.0 lb

## 2018-02-27 DIAGNOSIS — N951 Menopausal and female climacteric states: Secondary | ICD-10-CM | POA: Diagnosis not present

## 2018-02-27 DIAGNOSIS — Z01419 Encounter for gynecological examination (general) (routine) without abnormal findings: Secondary | ICD-10-CM

## 2018-02-27 DIAGNOSIS — Z13820 Encounter for screening for osteoporosis: Secondary | ICD-10-CM

## 2018-02-27 MED ORDER — FLUOXETINE HCL 10 MG PO CAPS
20.0000 mg | ORAL_CAPSULE | Freq: Every day | ORAL | 12 refills | Status: DC
Start: 2018-02-27 — End: 2018-02-28

## 2018-02-27 NOTE — Telephone Encounter (Signed)
Patient was seen today and said you were sending in Rx for Prozac to help with hot flashes, after thinking about this medication she would prefer to have another medication sent to pharmacy. Please advise

## 2018-02-27 NOTE — Progress Notes (Signed)
Alicia SicklesGail M Irwin 1961/04/15 [id number]   History:    57 y.o. G2P2A0L1 Married.  Husband is a Optician, dispensingminister, were on the road for a few years, but [place]  RP:  Established patient presenting for annual gyn exam   HPI: S/P total Hysterectomy. Menopause with continued severe hot flushes and night sweats interfering with sleep when patient tries to wean from HRT which she has been taking x 15 yrs.  Currently on 1/2 her Estradiol patch 0.0375 weekly with recurrence of symptoms at that dosage.  No pelvic pain.  No pain with IC.  Urine normal.  No SUI since last year with increased Kegel exercises.  BMs normal.  Breasts normal.  Health labs with Fam MD.  Past medical history,surgical history, family history and social history were all reviewed and documented in the EPIC chart.  Gynecologic History No LMP recorded. Patient has had a hysterectomy. Contraception: status post hysterectomy Last Pap: 02/2017. Results were: Negative/HPV HR negative Last mammogram: 02/2017. Results were: [facility] Bone Density: Normal in 2012, will repeat now. Colonoscopy: 6 yrs ago  Obstetric History OB History  Gravida Para Term Preterm AB Living  2 2       1   SAB TAB Ectopic Multiple Live Births               # Outcome Date GA Lbr Len/2nd Weight Sex Delivery Anes PTL Lv  2 Para           1 Para              ROS: A ROS was performed and pertinent positives and negatives are included in the history.  GENERAL: No fevers or chills. HEENT: No change in vision, no earache, sore throat or sinus congestion. NECK: No pain or stiffness. CARDIOVASCULAR: No chest pain or pressure. No palpitations. PULMONARY: No shortness of breath, cough or wheeze. GASTROINTESTINAL: No abdominal pain, nausea, vomiting or diarrhea, melena or bright red blood per rectum. GENITOURINARY: No urinary frequency, urgency, hesitancy or dysuria. MUSCULOSKELETAL: No joint or muscle pain, no back pain, no recent trauma.  DERMATOLOGIC: No rash, no itching, no lesions. ENDOCRINE: No polyuria, polydipsia, no heat or cold intolerance. No recent change in weight. HEMATOLOGICAL: No anemia or easy bruising or bleeding. NEUROLOGIC: No headache, seizures, numbness, tingling or weakness. PSYCHIATRIC: No depression, no loss of interest in normal activity or change in sleep pattern.     Exam:   BP 132/88   Ht 5\' 2"  (1.575 m)   Wt 142 lb (64.4 kg)   BMI 25.97 kg/m   Body mass index is 25.97 kg/m.  General appearance : Well developed well nourished female. No acute distress HEENT: Eyes: no retinal hemorrhage or exudates,  Neck supple, trachea midline, no carotid bruits, no thyroidmegaly Lungs: Clear to auscultation, no rhonchi or wheezes, or rib retractions  Heart: Regular rate and rhythm, no murmurs or gallops Breast:Examined in sitting and supine position were symmetrical in appearance, no palpable masses or tenderness,  no skin retraction, no nipple inversion, no nipple discharge, no skin discoloration, no axillary or supraclavicular lymphadenopathy Abdomen: no palpable masses or tenderness, no rebound or guarding Extremities: no edema or skin discoloration or tenderness  Pelvic: Vulva: Normal             Vagina: No gross lesions or discharge  Cervix/Uterus absent  Adnexa  Without masses or tenderness  Anus: Normal   Assessment/Plan:  57 y.o. female for annual exam  1. Well female exam with routine gynecological exam Gynecologic exam status post total hysterectomy.  Pap test negative with negative high-risk HPV August 2018.  No indication to repeat this year.  Breast exam normal.  Screening mammogram to repeat now.  Health labs with family physician.  2. Menopause syndrome On HRT x 15 yrs.  D/C Estradiol patch now.  Will start on an antidepressant to control vasomotor symptoms.  Prozac 10 mg daily was initially prescribed, but patient call back preferring an alternative, therefore Zoloft 50 mg PO daily was  prescribed instead.  Patient was also recommended to add soy products in nutrition and black cohosh as needed.  3. Screening for osteoporosis Schedule bone density here now.  Calcium intake of 1.5 g/day including nutritional and supplemental recommended, vitamin D supplements and regular weightbearing physical activity. - DG Bone Density; Future  Other orders - Red Yeast Rice 600 MG CAPS; Take by mouth. - FLUoxetine (PROZAC) 10 MG capsule, switch to Zoloft 50 mg 1 tablet PO daily.  Genia DelMarie-Lyne Carlen Rebuck MD, 2:14 PM 02/27/2018

## 2018-02-28 ENCOUNTER — Other Ambulatory Visit: Payer: Self-pay | Admitting: Gynecology

## 2018-02-28 DIAGNOSIS — Z13820 Encounter for screening for osteoporosis: Secondary | ICD-10-CM

## 2018-02-28 MED ORDER — SERTRALINE HCL 50 MG PO TABS: 50.0000 mg | ORAL_TABLET | Freq: Every day | ORAL | 12 refills | Status: DC

## 2018-02-28 NOTE — Telephone Encounter (Signed)
Will try Zoloft 50 mg daily, which she has tolerated well in the past.  Prescription sent.  Please inform the patient.

## 2018-03-01 NOTE — Telephone Encounter (Signed)
Left detailed message on cell per DRP access.

## 2018-03-03 ENCOUNTER — Encounter: Payer: Self-pay | Admitting: Obstetrics & Gynecology

## 2018-03-03 NOTE — Patient Instructions (Signed)
1. Well female exam with routine gynecological exam Gynecologic exam status post total hysterectomy.  Pap test negative with negative high-risk HPV August 2018.  No indication to repeat this year.  Breast exam normal.  Screening mammogram to repeat now.  Health labs with family physician.  2. Menopause syndrome On HRT x 15 yrs.  D/C Estradiol patch now.  Will start on an antidepressant to control vasomotor symptoms.  Prozac 10 mg daily was initially prescribed, but patient call back preferring an alternative, therefore Zoloft 50 mg PO daily was prescribed instead.  Patient was also recommended to add soy products in nutrition and black cohosh as needed.  3. Screening for osteoporosis Schedule bone density here now.  Calcium intake of 1.5 g/day including nutritional and supplemental recommended, vitamin D supplements and regular weightbearing physical activity. - DG Bone Density; Future  Other orders - Red Yeast Rice 600 MG CAPS; Take by mouth. - FLUoxetine (PROZAC) 10 MG capsule, switch to Zoloft 50 mg 1 tablet PO daily.  Alicia Irwin, it was a pleasure seeing you today!

## 2018-03-07 ENCOUNTER — Encounter: Payer: Self-pay | Admitting: Gynecology

## 2018-03-07 ENCOUNTER — Ambulatory Visit (INDEPENDENT_AMBULATORY_CARE_PROVIDER_SITE_OTHER): Payer: BLUE CROSS/BLUE SHIELD

## 2018-03-07 ENCOUNTER — Other Ambulatory Visit: Payer: Self-pay | Admitting: Gynecology

## 2018-03-07 DIAGNOSIS — M85851 Other specified disorders of bone density and structure, right thigh: Secondary | ICD-10-CM | POA: Diagnosis not present

## 2018-03-07 DIAGNOSIS — Z13820 Encounter for screening for osteoporosis: Secondary | ICD-10-CM

## 2018-03-08 ENCOUNTER — Telehealth: Payer: Self-pay | Admitting: *Deleted

## 2018-03-08 MED ORDER — SERTRALINE HCL 50 MG PO TABS: 50.0000 mg | ORAL_TABLET | Freq: Every day | ORAL | 11 refills | Status: DC

## 2018-03-08 NOTE — Telephone Encounter (Signed)
Patient called asking what dose Dr.Lavoie prescribed for Zoloft. I told her 50 mg, states [facility] gave her 20 mg dose. I told her I don't see where Dr. Seymour BarsLavoie prescribed that dose. She asked I send to [facility] this was done.

## 2018-03-08 NOTE — Telephone Encounter (Signed)
Patient called back and said she actually has the Prozac 20 mg Rx (didn't have the Rx with her to tell me the name from previous conversation. She is going to stop the Prozac and start Zoloft 50 mg dose.

## 2018-03-16 ENCOUNTER — Other Ambulatory Visit: Payer: Self-pay | Admitting: Obstetrics & Gynecology

## 2018-04-08 DIAGNOSIS — Z1231 Encounter for screening mammogram for malignant neoplasm of breast: Secondary | ICD-10-CM | POA: Diagnosis not present

## 2018-05-26 ENCOUNTER — Other Ambulatory Visit: Payer: Self-pay | Admitting: Obstetrics & Gynecology

## 2018-06-14 DIAGNOSIS — F419 Anxiety disorder, unspecified: Secondary | ICD-10-CM | POA: Diagnosis not present

## 2018-06-14 DIAGNOSIS — Z0000 Encounter for general adult medical examination without abnormal findings: Secondary | ICD-10-CM | POA: Diagnosis not present

## 2018-06-14 DIAGNOSIS — I10 Essential (primary) hypertension: Secondary | ICD-10-CM | POA: Diagnosis not present

## 2018-06-26 ENCOUNTER — Ambulatory Visit: Payer: Self-pay | Admitting: Nurse Practitioner

## 2018-07-02 ENCOUNTER — Ambulatory Visit: Payer: Self-pay | Admitting: Nurse Practitioner
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
## 2018-07-31 ENCOUNTER — Encounter: Payer: Self-pay | Admitting: Nurse Practitioner

## 2018-07-31 ENCOUNTER — Ambulatory Visit (INDEPENDENT_AMBULATORY_CARE_PROVIDER_SITE_OTHER): Payer: BLUE CROSS/BLUE SHIELD | Admitting: Nurse Practitioner

## 2018-07-31 ENCOUNTER — Other Ambulatory Visit: Payer: Self-pay | Admitting: *Deleted

## 2018-07-31 VITALS — BP 140/90 | HR 73 | Ht 62.0 in | Wt 148.4 lb

## 2018-07-31 DIAGNOSIS — I10 Essential (primary) hypertension: Secondary | ICD-10-CM

## 2018-07-31 DIAGNOSIS — I493 Ventricular premature depolarization: Secondary | ICD-10-CM

## 2018-07-31 LAB — BASIC METABOLIC PANEL
BUN/Creatinine Ratio: 20 (ref 9–23)
BUN: 17 mg/dL (ref 6–24)
CO2: 26 mmol/L (ref 20–29)
Calcium: 9.7 mg/dL (ref 8.7–10.2)
Chloride: 101 mmol/L (ref 96–106)
Creatinine, Ser: 0.84 mg/dL (ref 0.57–1.00)
GFR calc Af Amer: 89 mL/min/{1.73_m2} (ref >59)
GFR calc non Af Amer: 77 mL/min/{1.73_m2} (ref >59)
Glucose: 91 mg/dL (ref 65–99)
Potassium: 4.4 mmol/L (ref 3.5–5.2)
Sodium: 142 mmol/L (ref 134–144)

## 2018-07-31 MED ORDER — AMLODIPINE BESYLATE 5 MG PO TABS: 5.0000 mg | ORAL_TABLET | Freq: Every day | ORAL | 3 refills | Status: DC

## 2018-07-31 MED ORDER — HYDROCHLOROTHIAZIDE 25 MG PO TABS: 25.0000 mg | ORAL_TABLET | Freq: Every day | ORAL | 3 refills | Status: DC

## 2018-07-31 NOTE — Patient Instructions (Addendum)
We will be checking the following labs today - BMET  If you have labs (blood work) drawn today and your tests are completely normal, you will receive your results only by: Marland Kitchen MyChart Message (if you have MyChart) OR . A paper copy in the mail If you have any lab test that is abnormal or we need to change your treatment, we will call you to review the results.   Medication Instructions:    Continue with your current medicines. BUT  I am adding HCTZ 25 mg to take one a day this is at your pharmacy   If you need a refill on your cardiac medications before your next appointment, please call your pharmacy.     Testing/Procedures To Be Arranged:  N/A  Follow-Up:   See me in one month with BMET   At [facility], you and your health needs are our priority.  As part of our continuing mission to provide you with exceptional heart care, we have created designated Provider Care Teams.  These Care Teams include your primary Cardiologist (physician) and Advanced Practice Providers (APPs -  Physician Assistants and Nurse Practitioners) who all work together to provide you with the care you need, when you need it.  Special Instructions:  . Think about what we talked about today.   Call the [facility] office at [phone] if you have any questions, problems or concerns.
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
## 2018-08-28 ENCOUNTER — Encounter: Payer: Self-pay | Admitting: Nurse Practitioner

## 2018-08-28 ENCOUNTER — Ambulatory Visit: Payer: BLUE CROSS/BLUE SHIELD | Admitting: Nurse Practitioner

## 2018-08-28 VITALS — BP 128/80 | HR 84 | Ht 62.0 in | Wt 146.8 lb

## 2018-08-28 DIAGNOSIS — I10 Essential (primary) hypertension: Secondary | ICD-10-CM | POA: Diagnosis not present
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
## 2018-08-28 NOTE — Patient Instructions (Addendum)
We will be checking the following labs today - NONE    Medication Instructions:    Continue with your current medicines for now.    If you need a refill on your cardiac medications before your next appointment, please call your pharmacy.     Testing/Procedures To Be Arranged:  N/A  Follow-Up:   See me in 3 months    At [facility], you and your health needs are our priority.  As part of our continuing mission to provide you with exceptional heart care, we have created designated Provider Care Teams.  These Care Teams include your primary Cardiologist (physician) and Advanced Practice Providers (APPs -  Physician Assistants and Nurse Practitioners) who all work together to provide you with the care you need, when you need it.  Special Instructions:  . Think about what we talked about today.   Call the [facility] office at [phone] if you have any questions, problems or concerns.

## 2018-11-12 ENCOUNTER — Telehealth: Payer: BLUE CROSS/BLUE SHIELD | Admitting: Nurse Practitioner

## 2018-12-23 NOTE — Telephone Encounter (Signed)
Virtual Visit Pre-Appointment Phone Call  "(Name), I am calling you today to discuss your upcoming appointment. We are currently trying to limit exposure to the virus that causes COVID-19 by seeing patients at home rather than in the office."  1. "What is the BEST phone number to call the day of the visit?" - include this in appointment notes  2. "Do you have or have access to (through a family member/friend) a smartphone with video capability that we can use for your visit?" a. If yes - list this number in appt notes as "cell" (if different from BEST phone #) and list the appointment type as a VIDEO visit in appointment notes b. If no - list the appointment type as a PHONE visit in appointment notes  3. Confirm consent - "In the setting of the current Covid19 crisis, you are scheduled for a (phone or video) visit with your provider on (Wednesday, June 17) at (10:15 am).  Just as we do with many in-office visits, in order for you to participate in this visit, we must obtain consent.  If you'd like, I can send this to your mychart (if signed up) or email for you to review.  Otherwise, I can obtain your verbal consent now.  All virtual visits are billed to your insurance company just like a normal visit would be.  By agreeing to a virtual visit, we'd like you to understand that the technology does not allow for your provider to perform an examination, and thus may limit your provider's ability to fully assess your condition. If your provider identifies any concerns that need to be evaluated in person, we will make arrangements to do so.  Finally, though the technology is pretty good, we cannot assure that it will always work on either your or our end, and in the setting of a video visit, we may have to convert it to a phone-only visit.  In either situation, we cannot ensure that we have a secure connection.  Are you willing to proceed?" STAFF: Did the patient verbally acknowledge consent to telehealth  visit? Document YES/NO here: YES.  4. Advise patient to be prepared - "Two hours prior to your appointment, go ahead and check your blood pressure, pulse, oxygen saturation, and your weight (if you have the equipment to check those) and write them all down. When your visit starts, your provider will ask you for this information. If you have an Apple Watch or Kardia device, please plan to have heart rate information ready on the day of your appointment. Please have a pen and paper handy nearby the day of the visit as well."  5. Give patient instructions for MyChart download to smartphone OR Doximity/Doxy.me as below if video visit (depending on what platform provider is using)  6. Inform patient they will receive a phone call 15 minutes prior to their appointment time (may be from unknown caller ID) so they should be prepared to answer    TELEPHONE CALL NOTE  Alicia Irwin Finlayson has been deemed a candidate for a follow-up tele-health visit to limit community exposure during the Covid-19 pandemic. I spoke with the patient via phone to ensure availability of phone/video source, confirm preferred email & phone number, and discuss instructions and expectations.  I reminded Alicia Irwin Delman to be prepared with any vital sign and/or heart rhythm information that could potentially be obtained via home monitoring, at the time of her visit. I reminded Alicia Irwin Willers to expect a phone  call prior to her visit.  Kabao Leite Avanell Shackleton 12/23/2018 8:31 AM   INSTRUCTIONS FOR DOWNLOADING THE MYCHART APP TO SMARTPHONE  - The patient must first make sure to have activated MyChart and know their login information - If Apple, go to App Store and type in MyChart in the search bar and download the app. If Android, ask patient to go to [employer] and type in [place] in the search bar and download the app. The app is free but as with any other app downloads, their phone may require them to verify saved payment information or  Apple/Android password.  - The patient will need to then log into the app with their MyChart username and password, and select [facility] as their healthcare provider to link the account. When it is time for your visit, go to the MyChart app, find appointments, and click Begin Video Visit. Be sure to Select Allow for your device to access the Microphone and Camera for your visit. You will then be connected, and your provider will be with you shortly.  **If they have any issues connecting, or need assistance please contact MyChart service desk [phone])**  **If using a computer, in order to ensure the best quality for their visit they will need to use either of the following Internet Browsers: [employer], or Google Chrome**  IF USING DOXIMITY or DOXY.ME - The patient will receive a link just prior to their visit by text.     FULL LENGTH CONSENT FOR TELE-HEALTH VISIT   I hereby voluntarily request, consent and authorize [facility] and its employed or contracted physicians, physician assistants, nurse practitioners or other licensed health care professionals (the Practitioner), to provide me with telemedicine health care services (the "Services") as deemed necessary by the treating Practitioner. I acknowledge and consent to receive the Services by the Practitioner via telemedicine. I understand that the telemedicine visit will involve communicating with the Practitioner through live audiovisual communication technology and the disclosure of certain medical information by electronic transmission. I acknowledge that I have been given the opportunity to request an in-person assessment or other available alternative prior to the telemedicine visit and am voluntarily participating in the telemedicine visit.  I understand that I have the right to withhold or withdraw my consent to the use of telemedicine in the course of my care at any time, without affecting my right to future care  or treatment, and that the Practitioner or I may terminate the telemedicine visit at any time. I understand that I have the right to inspect all information obtained and/or recorded in the course of the telemedicine visit and may receive copies of available information for a reasonable fee.  I understand that some of the potential risks of receiving the Services via telemedicine include:  Marland Kitchen Delay or interruption in medical evaluation due to technological equipment failure or disruption; . Information transmitted may not be sufficient (e.g. poor resolution of images) to allow for appropriate medical decision making by the Practitioner; and/or  . In rare instances, security protocols could fail, causing a breach of personal health information.  Furthermore, I acknowledge that it is my responsibility to provide information about my medical history, conditions and care that is complete and accurate to the best of my ability. I acknowledge that Practitioner's advice, recommendations, and/or decision may be based on factors not within their control, such as incomplete or inaccurate data provided by me or distortions of diagnostic images or specimens that may result from  electronic transmissions. I understand that the practice of medicine is not an exact science and that Practitioner makes no warranties or guarantees regarding treatment outcomes. I acknowledge that I will receive a copy of this consent concurrently upon execution via email to the email address I last provided but may also request a printed copy by calling the office of [facility].    I understand that my insurance will be billed for this visit.   I have read or had this consent read to me. . I understand the contents of this consent, which adequately explains the benefits and risks of the Services being provided via telemedicine.  . I have been provided ample opportunity to ask questions regarding this consent and the Services and have had  my questions answered to my satisfaction. . I give my informed consent for the services to be provided through the use of telemedicine in my medical care  By participating in this telemedicine visit I agree to the above.
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
## 2018-12-25 ENCOUNTER — Encounter: Payer: Self-pay | Admitting: Nurse Practitioner

## 2018-12-25 ENCOUNTER — Other Ambulatory Visit: Payer: Self-pay

## 2018-12-25 ENCOUNTER — Telehealth (INDEPENDENT_AMBULATORY_CARE_PROVIDER_SITE_OTHER): Payer: BC Managed Care – PPO | Admitting: Nurse Practitioner

## 2018-12-25 ENCOUNTER — Encounter: Payer: Self-pay | Admitting: Physician Assistant

## 2018-12-25 ENCOUNTER — Ambulatory Visit
Admission: EM | Admit: 2018-12-25 | Discharge: 2018-12-25 | Disposition: A | Discharge status: Home / Self Care | Payer: BC Managed Care – PPO | Attending: Physician Assistant | Admitting: Physician Assistant

## 2018-12-25 VITALS — BP 109/86 | HR 86 | Ht 63.0 in | Wt 142.0 lb

## 2018-12-25 DIAGNOSIS — I493 Ventricular premature depolarization: Secondary | ICD-10-CM

## 2018-12-25 DIAGNOSIS — I10 Essential (primary) hypertension: Secondary | ICD-10-CM

## 2018-12-25 DIAGNOSIS — Z7189 Other specified counseling: Secondary | ICD-10-CM

## 2018-12-25 DIAGNOSIS — M546 Pain in thoracic spine: Secondary | ICD-10-CM | POA: Diagnosis not present

## 2018-12-25 DIAGNOSIS — I491 Atrial premature depolarization: Secondary | ICD-10-CM

## 2018-12-25 DIAGNOSIS — F439 Reaction to severe stress, unspecified: Secondary | ICD-10-CM

## 2018-12-25 MED ORDER — MELOXICAM 7.5 MG PO TABS: 7.5000 mg | ORAL_TABLET | Freq: Every day | ORAL | 0 refills | Status: DC

## 2018-12-25 NOTE — ED Triage Notes (Signed)
Pt presents to [facility] for assessment of mid back pain that started 2 weeks ago while working out at home doing a sit-up.  Patient states she stopped activity at that time, tried again about 5 days later, and had the same pain.  Yesterday she got the same pain while attempting exercise, and decided to "push through" and began to experience burning up her back, into her scaphoid area.  Pt was on the tele visit with her cardiologist today and they recommended she come to [facility] for EKG.

## 2018-12-25 NOTE — Patient Instructions (Addendum)
After Visit Summary:  We will be checking the following labs today - NONE  We do need to get your lipids rechecked - let us know when you would like to do this in the office and we will arrange.    Medication Instructions:    Continue with your current medicines.    If you need a refill on your cardiac medications before your next appointment, please call your pharmacy.     Testing/Procedures To Be Arranged:  Would consider calcium scoring test to help decide about medicine for cholesterol levels.   Follow-Up:   See [place] back in about 4 months.     At [facility], you and your health needs are our priority.  As part of our continuing mission to provide you with exceptional heart care, we have created designated Provider Care Teams.  These Care Teams include your primary Cardiologist (physician) and Advanced Practice Providers (APPs -  Physician Assistants and Nurse Practitioners) who all work together to provide you with the care you need, when you need it.  Special Instructions:  . Stay safe, stay home, wash your hands for at least 20 seconds and wear a mask when out in public.  . It was good to talk with you today.  . Call us with an update on how you are doing in the next few days and if you go to the Urgent Care.    Call the [place] office at [phone] if you have any questions, problems or concerns.

## 2018-12-25 NOTE — Discharge Instructions (Signed)
Start Mobic. Do not take ibuprofen (motrin/advil)/ naproxen (aleve) while on mobic. Ice/heat compresses as needed. Follow up with PCP for further evaluation if symptoms not improving.

## 2018-12-25 NOTE — ED Notes (Signed)
Patient able to ambulate independently  

## 2018-12-25 NOTE — ED Provider Notes (Signed)
[facility]    CSN: [id number] Arrival date & time: 12/25/18  1545     History   Chief Complaint Chief Complaint  Patient presents with  . Back Pain    HPI Alicia Irwin is a 58 y.o. female.   58 year old female with history of HTN, mitral regurgitation, PAC, PVCs, HLD, comes in for back pain. Patient states about 2 weeks ago, tried to do sit ups and felt pain to the mid back with mild burning sensation. Rested few days and tried again with same result. States yesterday, attempted sit ups again and felt same symptoms. She "pushed through" and now pain is staying with burning sensation to the left thoracic back that is now constant. Denies any aggravating or alleviating factor. Denies radiation down the arm. Denies chest pain, shortness of breath. Denies nausea/vomiting. She has been trying other exercises without any exertional chest pain/discomfort. She took some advil with mild relief. States talked with her cardiologist, who suggested to come in for EKG.      Past Medical History:  Diagnosis Date  . Anxiety   . Cervicalgia   . HTN (hypertension)   . Hypercholesteremia   . Insomnia   . Migraine headache   . Mitral regurgitation    mild to moderate by echo 07/2012  . Osteopenia 02/2018   T score -1.4 FRAX 7.1% / 0.5%  . Other premature beats    PVC's/PAC's intolerant to bystolic, not taking metoprolol  . PAC (premature atrial contraction)   . PVC's (premature ventricular contractions)     Patient Active Problem List   Diagnosis Date Noted  . Mitral regurgitation   . PVC's (premature ventricular contractions)   . PAC (premature atrial contraction)   . Hypercholesteremia   . HTN (hypertension)   . Cervicalgia   . Insomnia   . Anxiety     Past Surgical History:  Procedure Laterality Date  . ABDOMINAL HYSTERECTOMY  2004   ABDOMINAL HYST  . COLPOSCOPY  2002  . TUBAL LIGATION      OB History    Gravida  2   Para  2   Term      Preterm      AB      Living  1     SAB      TAB      Ectopic      Multiple      Live Births               Home Medications    Prior to Admission medications   Medication Sig Start Date End Date Taking? Authorizing Provider  ALPRAZolam Prudy Feeler(XANAX) 0.25 MG tablet Take 0.25 mg by mouth as needed for anxiety.  06/20/18   [provider]  amLODipine (NORVASC) 5 MG tablet Take 1 tablet (5 mg total) by mouth daily. 07/31/18   Rosalio MacadamiaGerhardt, Lori C, NP  fluticasone (FLONASE) 50 MCG/ACT nasal spray Place 1 spray into both nostrils daily.     [provider]  meloxicam (MOBIC) 7.5 MG tablet Take 1 tablet (7.5 mg total) by mouth daily. 12/25/18   Cathie HoopsYu, Amy V, PA-C  Multiple Vitamin (MULTIVITAMIN) tablet Take 1 tablet by mouth daily.    [provider]    Family History Family History  Problem Relation Age of Onset  . Aortic aneurysm Mother   . Hypertension Father   . Cancer Father        PROSTATE    Social History Social  History   Tobacco Use  . Smoking status: Never Smoker  . Smokeless tobacco: Never Used  Substance Use Topics  . Alcohol use: Yes    Comment: occasional wine  . Drug use: No     Allergies   Contrast media [iodinated diagnostic agents]   Review of Systems Review of Systems  Reason unable to perform ROS: See HPI as above.     Physical Exam Triage Vital Signs ED Triage Vitals  Enc Vitals Group     BP 12/25/18 1557 (!) 143/88     Pulse Rate 12/25/18 1557 71     Resp 12/25/18 1557 18     Temp 12/25/18 1557 98.5 F (36.9 C)     Temp Source 12/25/18 1557 Oral     SpO2 12/25/18 1557 98 %     Weight --      Height --      Head Circumference --      Peak Flow --      Pain Score 12/25/18 1558 3     Pain Loc --      Pain Edu? --      Excl. in GC? --    No data found.  Updated Vital Signs BP (!) 143/88 (BP Location: Left Arm)   Pulse 71   Temp 98.5 F (36.9 C) (Oral)   Resp 18   SpO2 98%   Physical Exam Constitutional:       General: She is not in acute distress.    Appearance: She is well-developed. She is not diaphoretic.  HENT:     Head: Normocephalic and atraumatic.  Eyes:     Conjunctiva/sclera: Conjunctivae normal.     Pupils: Pupils are equal, round, and reactive to light.  Cardiovascular:     Rate and Rhythm: Normal rate and regular rhythm.     Heart sounds: Normal heart sounds. No murmur. No friction rub. No gallop.   Pulmonary:     Effort: Pulmonary effort is normal. No accessory muscle usage or respiratory distress.     Breath sounds: Normal breath sounds. No stridor. No decreased breath sounds, wheezing, rhonchi or rales.  Musculoskeletal:     Comments: No tenderness to palpation spinous processes. Tenderness to palpation of left mid thoracic region. Full range of motion of neck, shoulder, back. Strength normal and equal bilaterally. Sensation intact and equal bilaterally.  Radial pulses 2+ and equal bilaterally. Capillary refill less than 2 seconds.   Skin:    General: Skin is warm and dry.  Neurological:     Mental Status: She is alert and oriented to person, place, and time.      UC Treatments / Results  Labs (all labs ordered are listed, but only abnormal results are displayed) Labs Reviewed - No data to display  EKG None      Radiology No results found.  Procedures Procedures (including critical care time)  Medications Ordered in UC Medications - No data to display  Initial Impression / Assessment and Plan / UC Course  I have reviewed the triage vital signs and the nursing notes.  Pertinent labs & imaging results that were available during my care of the patient were reviewed by me and considered in my medical decision making (see chart for details).    EKG NSR, 69bpm, incomplete RBBB, no acute ST changes, unchanged from prior. Discussed symptoms more likely MSK. Will start mobic. Ice compress. Discussed changes to daily activity to prevent further injury. Return  precautions given. Patient expresses  understanding and agrees to plan.  Final Clinical Impressions(s) / UC Diagnoses   Final diagnoses:  Acute left-sided thoracic back pain    ED Prescriptions    Medication Sig Dispense Auth. Provider   meloxicam (MOBIC) 7.5 MG tablet Take 1 tablet (7.5 mg total) by mouth daily. 15 tablet Tobin Chad, [place] 12/25/18 1721

## 2019-01-24 DIAGNOSIS — D2261 Melanocytic nevi of right upper limb, including shoulder: Secondary | ICD-10-CM | POA: Diagnosis not present

## 2019-01-24 DIAGNOSIS — D2271 Melanocytic nevi of right lower limb, including hip: Secondary | ICD-10-CM | POA: Diagnosis not present

## 2019-01-24 DIAGNOSIS — D2230 Melanocytic nevi of unspecified part of face: Secondary | ICD-10-CM | POA: Diagnosis not present

## 2019-01-24 DIAGNOSIS — D2262 Melanocytic nevi of left upper limb, including shoulder: Secondary | ICD-10-CM | POA: Diagnosis not present

## 2019-02-28 ENCOUNTER — Other Ambulatory Visit: Payer: Self-pay

## 2019-03-03 ENCOUNTER — Other Ambulatory Visit: Payer: Self-pay

## 2019-03-03 ENCOUNTER — Encounter: Payer: Self-pay | Admitting: Obstetrics & Gynecology

## 2019-03-03 ENCOUNTER — Ambulatory Visit (INDEPENDENT_AMBULATORY_CARE_PROVIDER_SITE_OTHER): Payer: BC Managed Care – PPO | Admitting: Obstetrics & Gynecology

## 2019-03-03 VITALS — BP 134/78 | Ht 62.5 in | Wt 138.0 lb

## 2019-03-03 DIAGNOSIS — Z01419 Encounter for gynecological examination (general) (routine) without abnormal findings: Secondary | ICD-10-CM

## 2019-03-03 DIAGNOSIS — Z780 Asymptomatic menopausal state: Secondary | ICD-10-CM

## 2019-03-03 DIAGNOSIS — Z90710 Acquired absence of both cervix and uterus: Secondary | ICD-10-CM

## 2019-03-03 DIAGNOSIS — M8588 Other specified disorders of bone density and structure, other site: Secondary | ICD-10-CM | POA: Diagnosis not present

## 2019-03-03 DIAGNOSIS — M85851 Other specified disorders of bone density and structure, right thigh: Secondary | ICD-10-CM

## 2019-03-03 NOTE — Progress Notes (Signed)
Alicia Irwin [birth year] [id number]   History:    58 y.o. G2P2A0L1 Married.  Husband is a Company secretary, were on the road for a few years, but now back in [place].  RP:  Established patient presenting for annual gyn exam   HPI: S/P total Hysterectomy. Menopause, well off of HRT x 1 year.  No pelvic pain.  No pain with IC.  Urine normal.  No SUI since last year with increased Kegel exercises. Occasional urgency.  BMs normal.  Breasts normal.  BMI 24.84.  Health labs with Fam MD.  Past medical history,surgical history, family history and social history were all reviewed and documented in the EPIC chart.  Gynecologic History No LMP recorded. Patient has had a hysterectomy. Contraception: status post hysterectomy Last Pap: 02/2017. Results were: Negative/HPV HR negative Last mammogram: 2019. Results were: normal Bone Density: 02/2018 Rt femoral neck Osteopenia -1.4 Colonoscopy: 4 yrs ago  Obstetric History OB History  Gravida Para Term Preterm AB Living  2 2       1   SAB TAB Ectopic Multiple Live Births               # Outcome Date GA Lbr Len/2nd Weight Sex Delivery Anes PTL Lv  2 Para           1 Para              ROS: A ROS was performed and pertinent positives and negatives are included in the history.  GENERAL: No fevers or chills. HEENT: No change in vision, no earache, sore throat or sinus congestion. NECK: No pain or stiffness. CARDIOVASCULAR: No chest pain or pressure. No palpitations. PULMONARY: No shortness of breath, cough or wheeze. GASTROINTESTINAL: No abdominal pain, nausea, vomiting or diarrhea, melena or bright red blood per rectum. GENITOURINARY: No urinary frequency, urgency, hesitancy or dysuria. MUSCULOSKELETAL: No joint or muscle pain, no back pain, no recent trauma. DERMATOLOGIC: No rash, no itching, no lesions. ENDOCRINE: No polyuria, polydipsia, no heat or cold intolerance. No recent change in weight. HEMATOLOGICAL: No anemia or easy bruising or bleeding.  NEUROLOGIC: No headache, seizures, numbness, tingling or weakness. PSYCHIATRIC: No depression, no loss of interest in normal activity or change in sleep pattern.     Exam:   BP 134/78   Ht 5' 2.5" (1.588 m)   Wt 138 lb (62.6 kg)   BMI 24.84 kg/m   Body mass index is 24.84 kg/m.  General appearance : Well developed well nourished female. No acute distress HEENT: Eyes: no retinal hemorrhage or exudates,  Neck supple, trachea midline, no carotid bruits, no thyroidmegaly Lungs: Clear to auscultation, no rhonchi or wheezes, or rib retractions  Heart: Regular rate and rhythm, no murmurs or gallops Breast:Examined in sitting and supine position were symmetrical in appearance, no palpable masses or tenderness,  no skin retraction, no nipple inversion, no nipple discharge, no skin discoloration, no axillary or supraclavicular lymphadenopathy Abdomen: no palpable masses or tenderness, no rebound or guarding Extremities: no edema or skin discoloration or tenderness  Pelvic: Vulva: Normal             Vagina: No gross lesions or discharge  Cervix/Uterus absent  Adnexa  Without masses or tenderness  Anus: Normal   Assessment/Plan:  58 y.o. female for annual exam   1. Well female exam with routine gynecological exam Gynecologic exam status post total hysterectomy and menopause.  Pap test in August 2018 was negative with negative high-risk HPV, no indication to repeat this  year.  Breast exam normal.  [facility] was normal per patient, will obtain report and patient will schedule again this year.  Good body mass index at 24.84.  Aerobic physical activities recommended 5 times a week and weightlifting every 2 days.  Continue with healthy nutrition.  2. S/P total hysterectomy  3. Postmenopause Well off of hormone replacement therapy since last year.  4. Osteopenia of neck of right femur Very mild osteopenia on bone density August 2019 with right femoral  neck at -1.4, all other sites normal.  Recommend vitamin D supplements, calcium intake of 1200 mg daily and regular weightbearing physical activities.  [name] MD, 4:11 PM 03/03/2019

## 2019-03-03 NOTE — Patient Instructions (Signed)
1. Well female exam with routine gynecological exam Gynecologic exam status post total hysterectomy and menopause.  Pap test in August 2018 was negative with negative high-risk HPV, no indication to repeat this year.  Breast exam normal.  Screening mammogram at [facility] last year was normal per patient, will obtain report and patient will schedule again this year.  Good body mass index at 24.84.  Aerobic physical activities recommended 5 times a week and weightlifting every 2 days.  Continue with healthy nutrition.  2. S/P total hysterectomy  3. Postmenopause Well off of hormone replacement therapy since last year.  4. Osteopenia of neck of right femur  Baker Janus, it was a pleasure seeing you today!

## 2019-04-08 ENCOUNTER — Encounter: Payer: Self-pay | Admitting: Gynecology

## 2019-04-14 DIAGNOSIS — Z1231 Encounter for screening mammogram for malignant neoplasm of breast: Secondary | ICD-10-CM | POA: Diagnosis not present

## 2019-04-15 ENCOUNTER — Ambulatory Visit: Payer: BC Managed Care – PPO | Admitting: Nurse Practitioner

## 2019-04-29 ENCOUNTER — Ambulatory Visit: Payer: BC Managed Care – PPO | Admitting: Nurse Practitioner

## 2019-05-15 NOTE — Progress Notes (Deleted)
CARDIOLOGY OFFICE NOTE  Date:  05/15/2019    Alicia Irwin Date of Birth: Dec 22, 1960 Medical Record #[id number]  PCP:  Renford DillsPolite, Ronald, MD  Cardiologist:  Tyrone SageGerhardt & ***    No chief complaint on file.   History of Present Illness: Alicia SicklesGail M Branden is a 58 y.o. female who presents today for a *** She has a hx of PACs/PVCs, HTN. She has not tolerated Bystolic for her palpitations and decided not to take Metoprololin the past. Last seen by Dr. Armanda Magicraci Turner 03/31/15. BP was uncontrolled. She was placed on Amlodipine. Significant stress with the murders of her 2 sons back in 2011.  I have followed her over the past several years - lots of stress. She has had some atypical chest pain - GXT has been reassuring. Struggles with staying on track with CV risk factor modification.  When seen in January she was not doing well - her weight was up, BP not good, lipids not good, etc.with a tremendous amount of stress, using wine as a coping mechanism, etc. 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  I added HCTZ but she did not tolerate - caused too much urinary frequency. She was trying to get back on track with taking care of herself and on return she was doing better.   The patient does not have symptoms concerning for COVID-19 infection (fever, chills, cough, or new shortness of breath).   Seen today via D[employer]imity video. She has consented for this visit. Lots of concerns. 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 She admits she has been eating junk food, not walking, still drinking wine, etc and keeps "falling off the wagon" - trying to get back on track. She has had a burning sensation in her back and a knot in her chest with trying to do sit ups. She has had this burning sensation at night - she is thinking she has a tumor, having heart issues - seems worse with trying to do the sit ups - went to sleep with it and woke with it  today. Not short of breath and no radiation. Just started a few days ago - started with this "knot" - that came and went - then the burning sensation developed which is now pretty constant.  BP is fine.   She has not tried any Advil. She has been able to sleep without issue.  The patient {does/does not:200015} have symptoms concerning for COVID-19 infection (fever, chills, cough, or new shortness of breath).   Comes in today. Here with   Past Medical History:  Diagnosis Date   Anxiety    Cervicalgia    HTN (hypertension)    Hypercholesteremia    Insomnia    Migraine headache    Mitral regurgitation    mild to moderate by echo 07/2012   Osteopenia 02/2018   T score -1.4 FRAX 7.1% / 0.5%   Other premature beats    PVC's/PAC's intolerant to bystolic, not taking metoprolol   PAC (premature atrial contraction)    PVC's (premature ventricular contractions)     Past Surgical History:  Procedure Laterality Date   ABDOMINAL HYSTERECTOMY  2004   ABDOMINAL HYST   COLPOSCOPY  2002   TUBAL LIGATION       Medications: No outpatient medications have been marked as taking for the 05/21/19 encounter (Appointment) with Rosalio MacadamiaGerhardt, Tejah Brekke C, NP.     Allergies: Allergies  Allergen  Reactions   Contrast Media [Iodinated Diagnostic Agents] Hives and Rash    Patient to get pre meds before ct scans     Social History: The patient  reports that she has never smoked. She has never used smokeless tobacco. She reports current alcohol use. She reports that she does not use drugs.   Family History: The patient's ***family history includes Aortic aneurysm in her mother; Cancer in her father; Hypertension in her father.   Review of Systems: Please see the history of present illness.   All other systems are reviewed and negative.   Physical Exam: VS:  There were no vitals taken for this visit. Marland Kitchen  BMI There is no height or weight on file to calculate BMI.  Wt Readings from Last 3  Encounters:  03/03/19 138 lb (62.6 kg)  12/25/18 142 lb (64.4 kg)  08/28/18 146 lb 12.8 oz (66.6 kg)    General: Pleasant. Well developed, well nourished and in no acute distress.   HEENT: Normal.  Neck: Supple, no JVD, carotid bruits, or masses noted.  Cardiac: ***Regular rate and rhythm. No murmurs, rubs, or gallops. No edema.  Respiratory:  Lungs are clear to auscultation bilaterally with normal work of breathing.  GI: Soft and nontender.  MS: No deformity or atrophy. Gait and ROM intact.  Skin: Warm and dry. Color is normal.  Neuro:  Strength and sensation are intact and no gross focal deficits noted.  Psych: Alert, appropriate and with normal affect.   LABORATORY DATA:  EKG:  EKG {ACTION; IS/IS RCV:[id number]} ordered today. This demonstrates ***.  Lab Results  Component Value Date   WBC 7.4 08/10/2008   HGB 13.8 08/10/2008   HCT 40.0 08/10/2008   PLT 269 08/10/2008   GLUCOSE 91 07/31/2018   ALT 12 08/10/2008   AST 27 08/10/2008   NA 142 07/31/2018   K 4.4 07/31/2018   CL 101 07/31/2018   CREATININE 0.84 07/31/2018   BUN 17 07/31/2018   CO2 26 07/31/2018     BNP (last 3 results) No results for input(s): BNP in the last 8760 hours.  ProBNP (last 3 results) No results for input(s): PROBNP in the last 8760 hours.   Other Studies Reviewed Today:   Assessment/Plan: GXTStudy Highlights10/2018   Blood pressure demonstrated a normal response to exercise.  There was no ST segment deviation noted during stress.  Normal exercise stress test at an adequate workload of 8.5 mets.  The patient achieved 101% of MPHR with no symptoms noted.  This is a low risk study.     ASSESSMENT & PLAN:    1.Back pain - unclear etiology - she is going to take some Advil now - she has risk factors for CAD - her lipids are not treated - if her symptoms persist she is going to need further evaluation with EKG.   On call back - she was improving. She had no further  burning in her back. She is still considering seeing evaluation at Urgent Care later today - she is worried this may be a disc issue. She is planning on taking more Advil later today as well. She will be in touch with [place] and let us know how things turn out in the next few days.   2. Significant situational stress - caregiver role - unhealthy coping mechanisms - chronic  3. HTN - BP ok today - was not able to tolerate HCTZ in the past - no changes made today.   4. HLD -  not treated - very resistant to taking statin - she notes that Red Yeast Rice caused myalgias and she stopped taking. We've discussed having calcium scoring done - she will consider. She will let us know about getting labs rechecked.   5. Palpitations - not endorsed.    Marland Kitchen COVID-19 Education: The signs and symptoms of COVID-19 were discussed with the patient and how to seek care for testing (follow up with PCP or arrange E-visit).  The importance of social distancing, staying at home, hand hygiene and wearing a mask when out in public were discussed today.  Current medicines are reviewed with the patient today.  The patient does not have concerns regarding medicines other than what has been noted above.  The following changes have been made:  See above.  Labs/ tests ordered today include:   No orders of the defined types were placed in this encounter.    Disposition:   FU with *** in {gen number [id number]} {Days to years:10300}.   Patient is agreeable to this plan and will call if any problems develop in the interim.   SignedNorma Fredrickson, NP  05/15/2019 6:15 PM  [facility] [address] [place], [place]  [address] Phone: [phone] Fax: [phone]

## 2019-05-19 ENCOUNTER — Telehealth: Payer: Self-pay | Admitting: *Deleted

## 2019-05-19 NOTE — Telephone Encounter (Signed)
Pt sent mychart message pt has been exposed to COVID, consented through mychart to have telehealth visit.  Sent through [employer] all instructions.   Virtual Visit Pre-Appointment Phone Call  "(Name), I am calling you today to discuss your upcoming appointment. We are currently trying to limit exposure to the virus that causes COVID-19 by seeing patients at home rather than in the office."  1. "What is the BEST phone number to call the day of the visit?" - include this in appointment notes  2. "Do you have or have access to (through a family member/friend) a smartphone with video capability that we can use for your visit?" a. If yes - list this number in appt notes as "cell" (if different from BEST phone #) and list the appointment type as a VIDEO visit in appointment notes b. If no - list the appointment type as a PHONE visit in appointment notes  3. Confirm consent - "In the setting of the current Covid19 crisis, you are scheduled for a (phone or video) visit with your provider on (Wednesday, November 11) at (3:00 pm).  Just as we do with many in-office visits, in order for you to participate in this visit, we must obtain consent.  If you'd like, I can send this to your mychart (if signed up) or email for you to review.  Otherwise, I can obtain your verbal consent now.  All virtual visits are billed to your insurance company just like a normal visit would be.  By agreeing to a virtual visit, we'd like you to understand that the technology does not allow for your provider to perform an examination, and thus may limit your provider's ability to fully assess your condition. If your provider identifies any concerns that need to be evaluated in person, we will make arrangements to do so.  Finally, though the technology is pretty good, we cannot assure that it will always work on either your or our end, and in the setting of a video visit, we may have to convert it to a phone-only visit.  In either  situation, we cannot ensure that we have a secure connection.  Are you willing to proceed?" STAFF: Did the patient verbally acknowledge consent to telehealth visit? Document YES/NO here:YES  4. Advise patient to be prepared - "Two hours prior to your appointment, go ahead and check your blood pressure, pulse, oxygen saturation, and your weight (if you have the equipment to check those) and write them all down. When your visit starts, your provider will ask you for this information. If you have an Apple Watch or Kardia device, please plan to have heart rate information ready on the day of your appointment. Please have a pen and paper handy nearby the day of the visit as well."  5. Give patient instructions for MyChart download to smartphone OR Doximity/Doxy.me as below if video visit (depending on what platform provider is using)  6. Inform patient they will receive a phone call 15 minutes prior to their appointment time (may be from unknown caller ID) so they should be prepared to answer    TELEPHONE CALL NOTE  Alicia Irwin has been deemed a candidate for a follow-up tele-health visit to limit community exposure during the Covid-19 pandemic. I spoke with the patient via phone to ensure availability of phone/video source, confirm preferred email & phone number, and discuss instructions and expectations.  I reminded Alicia Irwin to be prepared with any vital sign and/or heart rhythm information that  could potentially be obtained via home monitoring, at the time of her visit. I reminded Alicia Irwin to expect a phone call prior to her visit.  Alicia Irwin Alicia Irwin 05/19/2019 1:00 PM   INSTRUCTIONS FOR DOWNLOADING THE MYCHART APP TO SMARTPHONE  - The patient must first make sure to have activated MyChart and know their login information - If Apple, go to [employer] and type in MyChart in the search bar and download the app. If Android, ask patient to go to [employer] and type in [place] in  the search bar and download the app. The app is free but as with any other app downloads, their phone may require them to verify saved payment information or Apple/Android password.  - The patient will need to then log into the app with their MyChart username and password, and select [facility] as their healthcare provider to link the account. When it is time for your visit, go to the MyChart app, find appointments, and click Begin Video Visit. Be sure to Select Allow for your device to access the Microphone and Camera for your visit. You will then be connected, and your provider will be with you shortly.  **If they have any issues connecting, or need assistance please contact MyChart service desk [phone])**  **If using a computer, in order to ensure the best quality for their visit they will need to use either of the following Internet Browsers: [employer], or Google Chrome**  IF USING DOXIMITY or DOXY.ME - The patient will receive a link just prior to their visit by text.     FULL LENGTH CONSENT FOR TELE-HEALTH VISIT   I hereby voluntarily request, consent and authorize [facility] and its employed or contracted physicians, physician assistants, nurse practitioners or other licensed health care professionals (the Practitioner), to provide me with telemedicine health care services (the "Services") as deemed necessary by the treating Practitioner. I acknowledge and consent to receive the Services by the Practitioner via telemedicine. I understand that the telemedicine visit will involve communicating with the Practitioner through live audiovisual communication technology and the disclosure of certain medical information by electronic transmission. I acknowledge that I have been given the opportunity to request an in-person assessment or other available alternative prior to the telemedicine visit and am voluntarily participating in the telemedicine visit.  I understand that  I have the right to withhold or withdraw my consent to the use of telemedicine in the course of my care at any time, without affecting my right to future care or treatment, and that the Practitioner or I may terminate the telemedicine visit at any time. I understand that I have the right to inspect all information obtained and/or recorded in the course of the telemedicine visit and may receive copies of available information for a reasonable fee.  I understand that some of the potential risks of receiving the Services via telemedicine include:  Marland Kitchen Delay or interruption in medical evaluation due to technological equipment failure or disruption; . Information transmitted may not be sufficient (e.g. poor resolution of images) to allow for appropriate medical decision making by the Practitioner; and/or  . In rare instances, security protocols could fail, causing a breach of personal health information.  Furthermore, I acknowledge that it is my responsibility to provide information about my medical history, conditions and care that is complete and accurate to the best of my ability. I acknowledge that Practitioner's advice, recommendations, and/or decision may be based on factors not within  their control, such as incomplete or inaccurate data provided by me or distortions of diagnostic images or specimens that may result from electronic transmissions. I understand that the practice of medicine is not an exact science and that Practitioner makes no warranties or guarantees regarding treatment outcomes. I acknowledge that I will receive a copy of this consent concurrently upon execution via email to the email address I last provided but may also request a printed copy by calling the office of [facility].    I understand that my insurance will be billed for this visit.   I have read or had this consent read to me. . I understand the contents of this consent, which adequately explains the benefits and risks of  the Services being provided via telemedicine.  . I have been provided ample opportunity to ask questions regarding this consent and the Services and have had my questions answered to my satisfaction. . I give my informed consent for the services to be provided through the use of telemedicine in my medical care  By participating in this telemedicine visit I agree to the above.

## 2019-05-20 NOTE — Progress Notes (Signed)
Telehealth Visit     Virtual Visit via Video Note   This visit type was conducted due to national recommendations for restrictions regarding the COVID-19 Pandemic (e.g. social distancing) in an effort to limit this patient's exposure and mitigate transmission in our community.  Due to her co-morbid illnesses, this patient is at least at moderate risk for complications without adequate follow up.  This format is felt to be most appropriate for this patient at this time.  All issues noted in this document were discussed and addressed.  A limited physical exam was performed with this format.  Please refer to the patient's chart for her consent to [facility].   Evaluation Performed:  Follow-up visit  This visit type was conducted due to national recommendations for restrictions regarding the COVID-19 Pandemic (e.g. social distancing).  This format is felt to be most appropriate for this patient at this time.  All issues noted in this document were discussed and addressed.  No physical exam was performed (except for noted visual exam findings with Video Visits).  Please refer to the patient's chart (MyChart message for video visits and phone note for telephone visits) for the patient's consent to telehealth [facility].  Date:  05/21/2019   ID:  Alicia Irwin, DOB 02-28-1961, MRN [id number]  Patient Location:  Home  Provider [facility]   [facility]  PCP:  Renford DillsPolite, Ronald, MD  Cardiologist:  Tyrone SageGerhardt & No primary care provider on file.  Electrophysiologist:  None   Chief Complaint:  Follow up  History of Present Illness:    Alicia Irwin is a 58 y.o. female who presents via audio/video conferencing for a telehealth visit today.  Seen for Dr. Mayford Knifeurner.Primarily follows with me.  She has a hx of PACs/PVCs & HTN. She has not tolerated Bystolic for her palpitations and decided not to take Metoprololin the past. Last seen by Dr. Armanda Magicraci Turner 03/31/15. BP was  uncontrolled. She was placed on Amlodipine. Significant stress with the murders of her 2 sons back in 2011.  I have followed her over the past several years - lots of stress as ongoing theme. She has had some atypical chest pain - GXT has been reassuring. 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   We did a telehealth visit back in June - lots of concerns - way off track with taking care of herself. Was having back pain - worried it was some heart issue - ended up sending to Urgent Care for evaluation since we could not see her here. It did not sound cardiac.   The patient does not have symptoms concerning for COVID-19 infection (fever, chills, cough, or new shortness of breath).   Seen today via Face Time. She previously consented for this visit. She is having a pretty hard time. She works in [employer] - very stressful with the [employer] and employees. She may have had a COVID exposure in the last few days. She is overwhelmed with life, her dad, etc. She is not drinking wine - noted that it made her sleep worse - as we had talked about before. She was on Zoloft in the past  - not sure what dose - she wants to make sure she "can still have emotions". BP up some. She has had a headache - her ear is hurting - she feels congested.   Past Medical History:  Diagnosis Date  . Anxiety   .  Cervicalgia   . HTN (hypertension)   . Hypercholesteremia   . Insomnia   . Migraine headache   . Mitral regurgitation    mild to moderate by echo 07/2012  . Osteopenia 02/2018   T score -1.4 FRAX 7.1% / 0.5%  . Other premature beats    PVC's/PAC's intolerant to bystolic, not taking metoprolol  . PAC (premature atrial contraction)   . PVC's (premature ventricular contractions)    Past Surgical History:  Procedure Laterality Date  . ABDOMINAL HYSTERECTOMY  2004   ABDOMINAL HYST  . COLPOSCOPY  2002  . TUBAL LIGATION        Current Meds  Medication Sig  . ALPRAZolam (XANAX) 0.25 MG tablet Take 0.25 mg by mouth as needed for anxiety.   Marland Kitchen amLODipine (NORVASC) 5 MG tablet Take 1 tablet (5 mg total) by mouth daily.  . fluticasone (FLONASE) 50 MCG/ACT nasal spray Place 1 spray into both nostrils daily.   . Multiple Vitamin (MULTIVITAMIN) tablet Take 1 tablet by mouth daily.     Allergies:   Contrast media [iodinated diagnostic agents]   Social History   Tobacco Use  . Smoking status: Never Smoker  . Smokeless tobacco: Never Used  Substance Use Topics  . Alcohol use: Yes    Comment: occasional wine  . Drug use: No     Family Hx: The patient's family history includes Aortic aneurysm in her mother; Cancer in her father; Hypertension in her father.  ROS:   Please see the history of present illness.   All other systems reviewed are negative.    Objective:    Vital Signs:  BP (!) 135/91   Pulse 66   Ht 5' 3.5" (1.613 m)   Wt 137 lb 8 oz (62.4 kg)   BMI 23.97 kg/m    Wt Readings from Last 3 Encounters:  05/21/19 137 lb 8 oz (62.4 kg)  03/03/19 138 lb (62.6 kg)  12/25/18 142 lb (64.4 kg)    Alert female in no acute distress. She appears quite overwhelmed. She has lost 9 pounds.    Labs/Other Tests and Data Reviewed:    Lab Results  Component Value Date   WBC 7.4 08/10/2008   HGB 13.8 08/10/2008   HCT 40.0 08/10/2008   PLT 269 08/10/2008   GLUCOSE 91 07/31/2018   ALT 12 08/10/2008   AST 27 08/10/2008   NA 142 07/31/2018   K 4.4 07/31/2018   CL 101 07/31/2018   CREATININE 0.84 07/31/2018   BUN 17 07/31/2018   CO2 26 07/31/2018       BNP (last 3 results) No results for input(s): BNP in the last 8760 hours.  ProBNP (last 3 results) No results for input(s): PROBNP in the last 8760 hours.    Prior CV studies:    The following studies were reviewed today:  GXTStudy Highlights10/2018   Blood pressure demonstrated a normal response to exercise.  There was no ST segment  deviation noted during stress.  Normal exercise stress test at an adequate workload of 8.5 mets.  The patient achieved 101% of MPHR with no symptoms noted.  This is a low risk study.     ASSESSMENT & PLAN:    1.Significant situational stress - caregiver role - unhealthy coping mechanisms - chronic - we are going to start Zoloft 25 mg a day - may increase to 50 mg.   2. HTN - BP up mildly - I think this is stress - see #1.  3. HLD - not treated - very resistant to taking statin - she is now considering having calcium scoring done - she will consider. She wants to come in a few weeks for labs.   4. Palpitations - not really endorsed today but I am assuming this is an issue just due to the amount of stress she is having.   5. COVID-19 Education: The signs and symptoms of COVID-19 were discussed with the patient and how to seek care for testing (follow up with PCP or arrange E-visit).  The importance of social distancing, staying at home, hand hygiene and wearing a mask when out in public were discussed today.  Patient Risk:   After full review of this patient's clinical status, I feel that they are at least moderate risk at this time.  Time:   Today, I have spent 12 minutes with the patient with telehealth technology discussing the above issues.     Medication Adjustments/Labs and Tests Ordered: Current medicines are reviewed at length with the patient today.  Concerns regarding medicines are outlined above.   Tests Ordered: No orders of the defined types were placed in this encounter.   Medication Changes: Meds ordered this encounter  Medications  . sertraline (ZOLOFT) 50 MG tablet    Sig: Take 1 tablet (50 mg total) by mouth daily. Start with 1/2 tablet daily - increase to a whole tablet in 2 to 3 weeks if needed.    Dispense:  30 tablet    Refill:  6    Order Specific Question:   Supervising Provider    Answer:   [place], PETER M [[id number]]    Disposition:  FU  with me in 4 to 6 weeks. Check lab on return.   Patient is agreeable to this plan and will call if any problems develop in the interim.   Amie Critchley, NP  05/21/2019 3:49 PM    [facility]

## 2019-05-21 ENCOUNTER — Other Ambulatory Visit: Payer: Self-pay

## 2019-05-21 ENCOUNTER — Encounter: Payer: Self-pay | Admitting: Nurse Practitioner

## 2019-05-21 ENCOUNTER — Ambulatory Visit: Payer: BC Managed Care – PPO | Admitting: Nurse Practitioner

## 2019-05-21 ENCOUNTER — Telehealth (INDEPENDENT_AMBULATORY_CARE_PROVIDER_SITE_OTHER): Payer: BC Managed Care – PPO | Admitting: Nurse Practitioner

## 2019-05-21 VITALS — BP 135/91 | HR 66 | Ht 63.5 in | Wt 137.5 lb

## 2019-05-21 DIAGNOSIS — F439 Reaction to severe stress, unspecified: Secondary | ICD-10-CM

## 2019-05-21 DIAGNOSIS — I10 Essential (primary) hypertension: Secondary | ICD-10-CM | POA: Diagnosis not present

## 2019-05-21 DIAGNOSIS — Z7189 Other specified counseling: Secondary | ICD-10-CM

## 2019-05-21 DIAGNOSIS — I493 Ventricular premature depolarization: Secondary | ICD-10-CM

## 2019-05-21 DIAGNOSIS — I491 Atrial premature depolarization: Secondary | ICD-10-CM

## 2019-05-21 MED ORDER — SERTRALINE HCL 50 MG PO TABS: 50.0000 mg | ORAL_TABLET | Freq: Every day | ORAL | 6 refills | Status: DC

## 2019-05-21 NOTE — Patient Instructions (Addendum)
After Visit Summary:  We will be checking the following labs today - NONE   Medication Instructions:    Continue with your current medicines. BUT  I am sending in a RX for Zoloft - start with 1/2 a tablet and then increase if needed to whole tablet in 2 to 3 weeks if needed.    If you need a refill on your cardiac medications before your next appointment, please call your pharmacy.     Testing/Procedures To Be Arranged:  N/A  Follow-Up:   See me in 4 to 6 weeks - we will plan on checking labs and we can talk about a calcium scoring test.     At [facility], you and your health needs are our priority.  As part of our continuing mission to provide you with exceptional heart care, we have created designated Provider Care Teams.  These Care Teams include your primary Cardiologist (physician) and Advanced Practice Providers (APPs -  Physician Assistants and Nurse Practitioners) who all work together to provide you with the care you need, when you need it.  Special Instructions:  . Stay safe, stay home, wash your hands for at least 20 seconds and wear a mask when out in public.  . It was good to talk with you today.    Call the [place] office at [phone] if you have any questions, problems or concerns.

## 2019-06-13 ENCOUNTER — Other Ambulatory Visit: Payer: Self-pay | Admitting: Nurse Practitioner

## 2019-06-24 ENCOUNTER — Ambulatory Visit: Payer: BC Managed Care – PPO | Admitting: Nurse Practitioner
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
## 2019-06-30 ENCOUNTER — Ambulatory Visit: Payer: BC Managed Care – PPO | Admitting: Nurse Practitioner

## 2019-07-02 ENCOUNTER — Other Ambulatory Visit: Payer: Self-pay

## 2019-07-02 ENCOUNTER — Ambulatory Visit: Payer: BC Managed Care – PPO | Admitting: Nurse Practitioner

## 2019-07-02 ENCOUNTER — Encounter: Payer: Self-pay | Admitting: Nurse Practitioner

## 2019-07-02 VITALS — BP 136/88 | HR 67 | Ht 63.0 in | Wt 136.9 lb

## 2019-07-02 DIAGNOSIS — I10 Essential (primary) hypertension: Secondary | ICD-10-CM

## 2019-07-02 DIAGNOSIS — E7800 Pure hypercholesterolemia, unspecified: Secondary | ICD-10-CM

## 2019-07-02 DIAGNOSIS — I491 Atrial premature depolarization: Secondary | ICD-10-CM

## 2019-07-02 NOTE — Patient Instructions (Addendum)
After Visit Summary:  We will be checking the following labs today - BMET, HPF and Lipids   Medication Instructions:    Continue with your current medicines.    If you need a refill on your cardiac medications before your next appointment, please call your pharmacy.     Testing/Procedures To Be Arranged:  Cardiac CT Scoring  Follow-Up:   See me in 6 months    At [facility], you and your health needs are our priority.  As part of our continuing mission to provide you with exceptional heart care, we have created designated Provider Care Teams.  These Care Teams include your primary Cardiologist (physician) and Advanced Practice Providers (APPs -  Physician Assistants and Nurse Practitioners) who all work together to provide you with the care you need, when you need it.  Special Instructions:  . Stay safe, stay home, wash your hands for at least 20 seconds and wear a mask when out in public.  . It was good to talk with you today.    Call the [place] office at [phone] if you have any questions, problems or concerns.

## 2019-07-03 LAB — BASIC METABOLIC PANEL
BUN/Creatinine Ratio: 14 (ref 9–23)
BUN: 12 mg/dL (ref 6–24)
CO2: 26 mmol/L (ref 20–29)
Calcium: 9.7 mg/dL (ref 8.7–10.2)
Chloride: 99 mmol/L (ref 96–106)
Creatinine, Ser: 0.88 mg/dL (ref 0.57–1.00)
GFR calc Af Amer: 83 mL/min/{1.73_m2} (ref >59)
GFR calc non Af Amer: 72 mL/min/{1.73_m2} (ref >59)
Glucose: 91 mg/dL (ref 65–99)
Potassium: 4.2 mmol/L (ref 3.5–5.2)
Sodium: 139 mmol/L (ref 134–144)

## 2019-07-03 LAB — LIPID PANEL
Chol/HDL Ratio: 4.1 ratio (ref 0.0–4.4)
Cholesterol, Total: 299 mg/dL — ABNORMAL HIGH (ref 100–199)
HDL: 73 mg/dL (ref >39)
LDL Chol Calc (NIH): 197 mg/dL — ABNORMAL HIGH (ref 0–99)
Triglycerides: 159 mg/dL — ABNORMAL HIGH (ref 0–149)
VLDL Cholesterol Cal: 29 mg/dL (ref 5–40)

## 2019-07-03 LAB — HEPATIC FUNCTION PANEL
ALT: 14 IU/L (ref 0–32)
AST: 21 IU/L (ref 0–40)
Albumin: 4.5 g/dL (ref 3.8–4.9)
Alkaline Phosphatase: 80 IU/L (ref 39–117)
Bilirubin Total: 0.5 mg/dL (ref 0.0–1.2)
Bilirubin, Direct: 0.11 mg/dL (ref 0.00–0.40)
Total Protein: 6.6 g/dL (ref 6.0–8.5)

## 2019-07-16 ENCOUNTER — Ambulatory Visit (INDEPENDENT_AMBULATORY_CARE_PROVIDER_SITE_OTHER)
Admission: RE | Admit: 2019-07-16 | Discharge: 2019-07-16 | Disposition: A | Discharge status: Home / Self Care | Payer: Self-pay | Source: Ambulatory Visit | Attending: Nurse Practitioner | Admitting: Nurse Practitioner

## 2019-07-16 ENCOUNTER — Other Ambulatory Visit: Payer: Self-pay

## 2019-07-16 DIAGNOSIS — E7800 Pure hypercholesterolemia, unspecified: Secondary | ICD-10-CM

## 2019-07-16 DIAGNOSIS — I10 Essential (primary) hypertension: Secondary | ICD-10-CM

## 2019-07-16 DIAGNOSIS — I491 Atrial premature depolarization: Secondary | ICD-10-CM

## 2019-07-21 DIAGNOSIS — Z23 Encounter for immunization: Secondary | ICD-10-CM | POA: Diagnosis not present

## 2019-08-16 ENCOUNTER — Other Ambulatory Visit: Payer: Self-pay | Admitting: Nurse Practitioner

## 2019-08-16 DIAGNOSIS — I10 Essential (primary) hypertension: Secondary | ICD-10-CM
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
## 2019-12-30 ENCOUNTER — Ambulatory Visit: Payer: BC Managed Care – PPO | Admitting: Nurse Practitioner

## 2020-03-03 ENCOUNTER — Encounter: Payer: BC Managed Care – PPO | Admitting: Obstetrics & Gynecology

## 2020-03-12 ENCOUNTER — Encounter: Payer: BC Managed Care – PPO | Admitting: Obstetrics & Gynecology

## 2020-03-26 ENCOUNTER — Encounter: Payer: Self-pay | Admitting: Obstetrics & Gynecology

## 2020-03-26 ENCOUNTER — Other Ambulatory Visit: Payer: Self-pay

## 2020-03-26 ENCOUNTER — Ambulatory Visit (INDEPENDENT_AMBULATORY_CARE_PROVIDER_SITE_OTHER): Payer: Managed Care, Other (non HMO) | Admitting: Obstetrics & Gynecology

## 2020-03-26 VITALS — BP 134/84 | Ht 63.5 in | Wt 145.0 lb

## 2020-03-26 DIAGNOSIS — Z01419 Encounter for gynecological examination (general) (routine) without abnormal findings: Secondary | ICD-10-CM

## 2020-03-26 DIAGNOSIS — Z90710 Acquired absence of both cervix and uterus: Secondary | ICD-10-CM

## 2020-03-26 DIAGNOSIS — Z780 Asymptomatic menopausal state: Secondary | ICD-10-CM

## 2020-03-26 DIAGNOSIS — M85851 Other specified disorders of bone density and structure, right thigh: Secondary | ICD-10-CM

## 2020-03-26 DIAGNOSIS — Z1272 Encounter for screening for malignant neoplasm of vagina: Secondary | ICD-10-CM

## 2020-03-26 NOTE — Addendum Note (Signed)
Addended by: Berna Spare A on: 03/26/2020 10:42 AM   Modules accepted: Orders

## 2020-03-26 NOTE — Progress Notes (Signed)
Alicia Irwin 1960/09/18 [id number]   History:    59 y.o. G2P2A0L1 Married.Husband is a Optician, dispensing, were on the road for a few years, but now back in [place].  Patient works for a [employer]  GT:[id number] presenting for annual gyn exam   HPI:S/P total Hysterectomy. Menopause, well on no HRT. No pelvic pain. No pain with IC. Urine normal. Minimal SUI, doing Kegel exercises. Occasional urgency when bladder is overfilled. BMs normal. Breasts normal.  BMI 25.28. Good fitness.  Health labs with Fam MD.  Past medical history,surgical history, family history and social history were all reviewed and documented in the EPIC chart.  Gynecologic History No LMP recorded. Patient has had a hysterectomy.  Obstetric History OB History  Gravida Para Term Preterm AB Living  2 2       2   SAB TAB Ectopic Multiple Live Births               # Outcome Date GA Lbr Len/2nd Weight Sex Delivery Anes PTL Lv  2 Para           1 Para              ROS: A ROS was performed and pertinent positives and negatives are included in the history.  GENERAL: No fevers or chills. HEENT: No change in vision, no earache, sore throat or sinus congestion. NECK: No pain or stiffness. CARDIOVASCULAR: No chest pain or pressure. No palpitations. PULMONARY: No shortness of breath, cough or wheeze. GASTROINTESTINAL: No abdominal pain, nausea, vomiting or diarrhea, melena or bright red blood per rectum. GENITOURINARY: No urinary frequency, urgency, hesitancy or dysuria. MUSCULOSKELETAL: No joint or muscle pain, no back pain, no recent trauma. DERMATOLOGIC: No rash, no itching, no lesions. ENDOCRINE: No polyuria, polydipsia, no heat or cold intolerance. No recent change in weight. HEMATOLOGICAL: No anemia or easy bruising or bleeding. NEUROLOGIC: No headache, seizures, numbness, tingling or weakness. PSYCHIATRIC: No depression, no loss of interest in normal activity or change in sleep pattern.      Exam:   BP 134/84   Ht 5' 3.5" (1.613 m)   Wt 145 lb (65.8 kg)   BMI 25.28 kg/m   Body mass index is 25.28 kg/m.  General appearance : Well developed well nourished female. No acute distress HEENT: Eyes: no retinal hemorrhage or exudates,  Neck supple, trachea midline, no carotid bruits, no thyroidmegaly Lungs: Clear to auscultation, no rhonchi or wheezes, or rib retractions  Heart: Regular rate and rhythm, no murmurs or gallops Breast:Examined in sitting and supine position were symmetrical in appearance, no palpable masses or tenderness,  no skin retraction, no nipple inversion, no nipple discharge, no skin discoloration, no axillary or supraclavicular lymphadenopathy Abdomen: no palpable masses or tenderness, no rebound or guarding Extremities: no edema or skin discoloration or tenderness  Pelvic: Vulva: Normal             Vagina: No gross lesions or discharge.  Pap reflex done.  Cervix/Uterus absent  Adnexa  Without masses or tenderness  Anus: Normal   Assessment/Plan:  59 y.o. female for annual exam   1. Encounter for Papanicolaou smear of vagina as part of routine gynecological examination Gynecologic exam status post total hysterectomy.  Pap test done at the vaginal vault.  Breast exam normal.  Screening mammogram October 2020 was negative.  Colonoscopy 8 years ago.  Health labs with family physician.  Body mass index 25.28.  Good fitness.  Will lower total calories/carbs to lose about  10 pounds.  2. S/P total hysterectomy  3. Postmenopause Well on no HRT.  4. Osteopenia of neck of right femur BD 02/2018 Osteopenia with T-Score -1.4 at the Rt Femoral Neck.  Vit D supplement, Ca++ 1200-1500 mg daily, weight bearing physical activities.  Repeat BD at 3 yrs.  Genia Del MD, 9:52 AM 03/26/2020

## 2020-03-29 LAB — PAP IG W/ RFLX HPV ASCU

## 2020-08-05 ENCOUNTER — Other Ambulatory Visit: Payer: Self-pay | Admitting: Nurse Practitioner

## 2020-08-05 DIAGNOSIS — I10 Essential (primary) hypertension: Secondary | ICD-10-CM

## 2020-08-05 IMAGING — CT CT HEART SCORING
2 series · 16 of 20 positions shown, 18 images · non-contrast
Comparison: None.
COMPARISON: None.

Addendum:
EXAM:
OVER-READ INTERPRETATION  CT CHEST

The following report is an over-read performed by radiologist Dr.
Pikus Cee [REDACTED] on 07/16/2019. This over-read
does not include interpretation of cardiac or coronary anatomy or
pathology. The coronary calcium score interpretation by the
cardiologist is attached.
CLINICAL DATA: Risk stratification
Coronary Calcium Score
TECHNIQUE: The patient was scanned on a Siemens Force scanner. Axial
non-contrast 3 mm slices were carried out through the heart. The
data set was analyzed on a dedicated work station and scored using
the Agatson method.

[Series 2: casc 3.0 i36f 2 bestdiast 66 % · axial · 0.35mm/px · z∈[-229,-127]mm · 8 of 44 slices shown, 10 images]
[im 5/44  vessel]
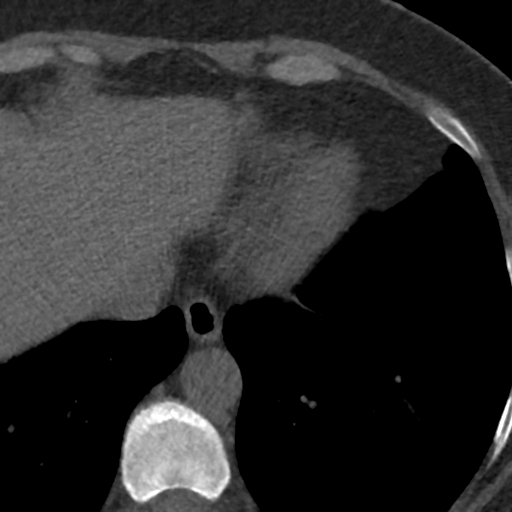
[im 5/44  lung]
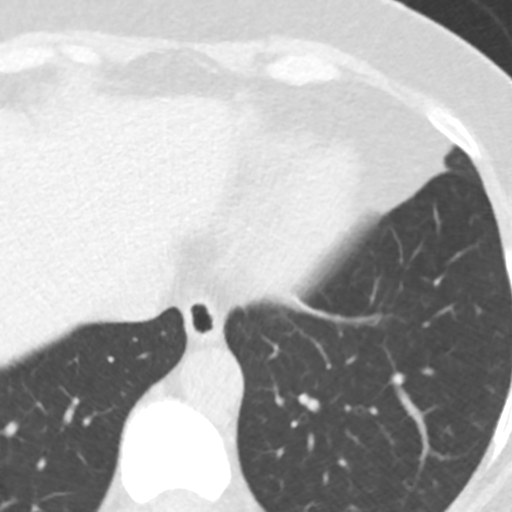
[im 10/44  vessel]
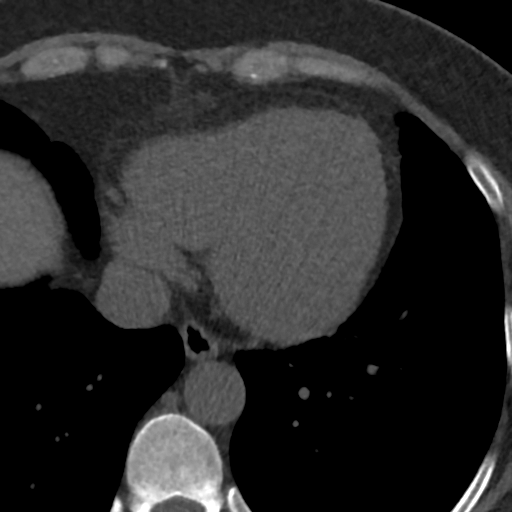
[im 15/44  vessel]
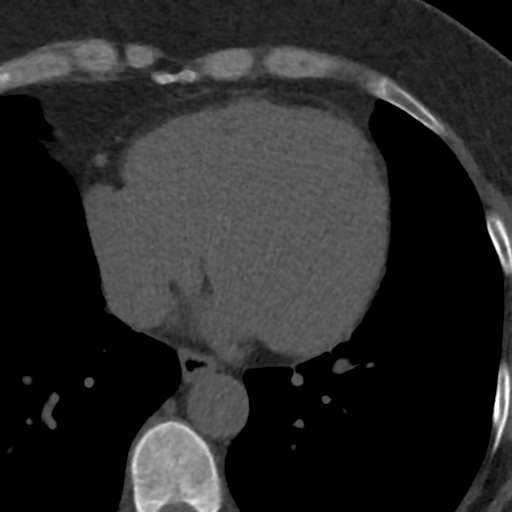
[im 20/44  vessel]
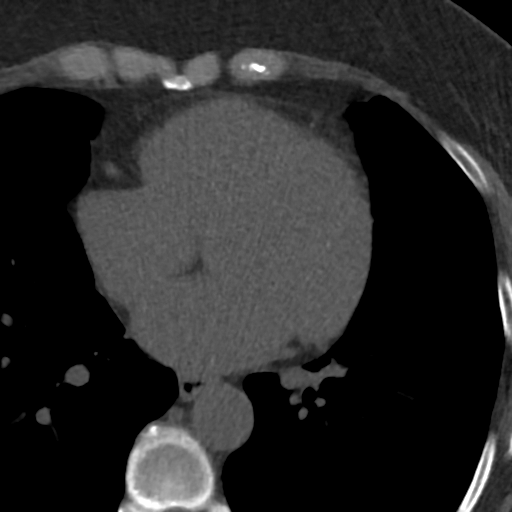
[im 24/44  vessel]
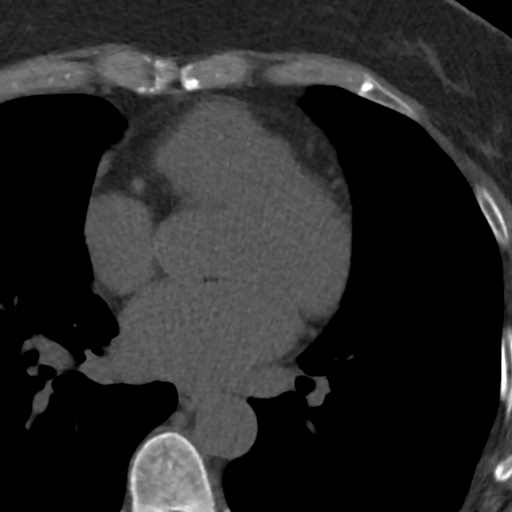
[im 24/44  lung]
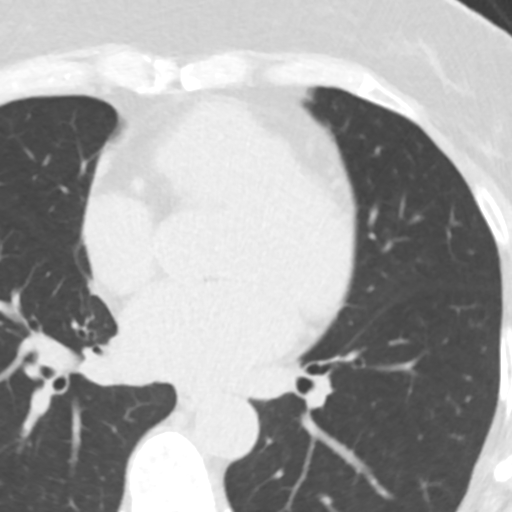
[im 29/44  vessel]
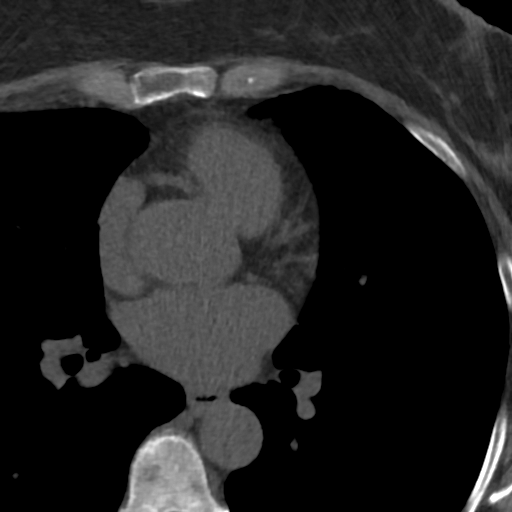
[im 34/44  vessel]
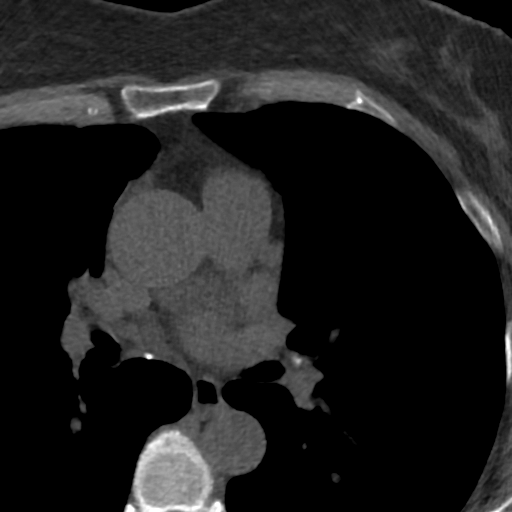
[im 39/44  vessel]
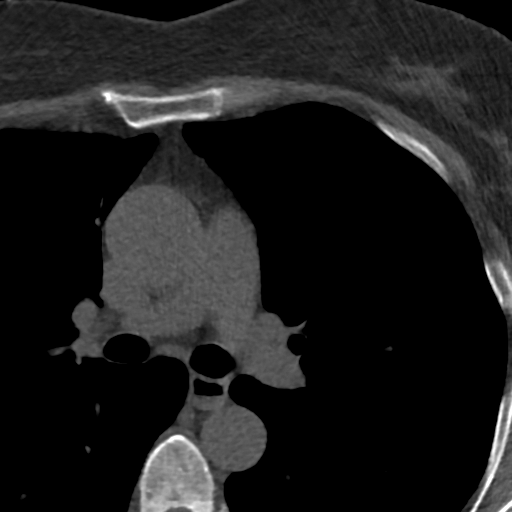

[Series 4: lung st 68 % · axial · 0.60mm/px · z∈[-228,-126]mm · 8 of 44 slices shown]
[im 5/44  lung]
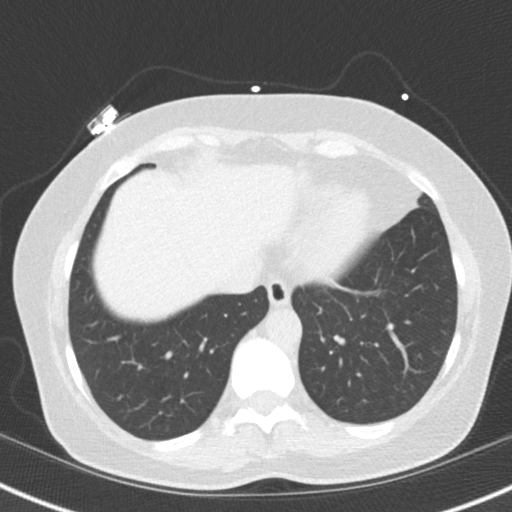
[im 10/44  lung]
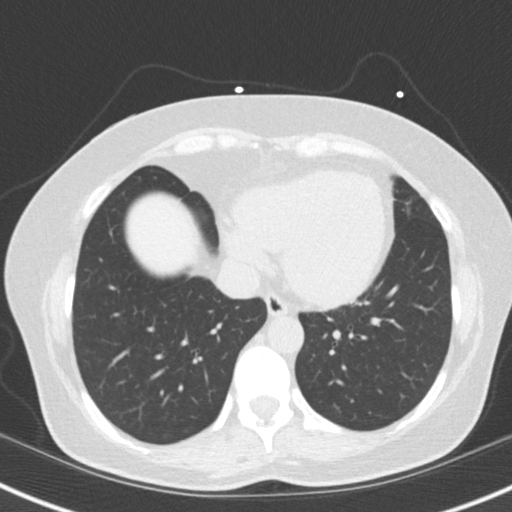
[im 15/44  lung]
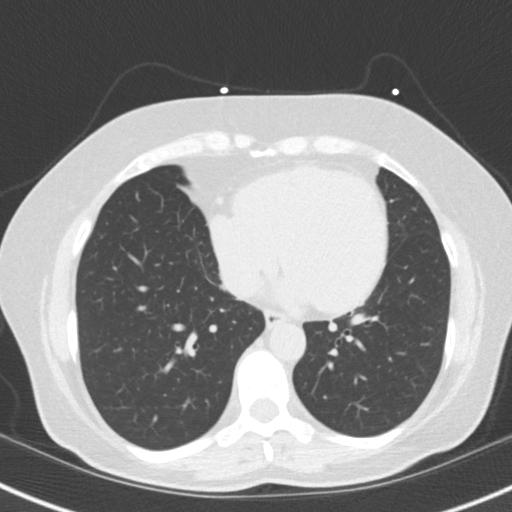
[im 20/44  lung]
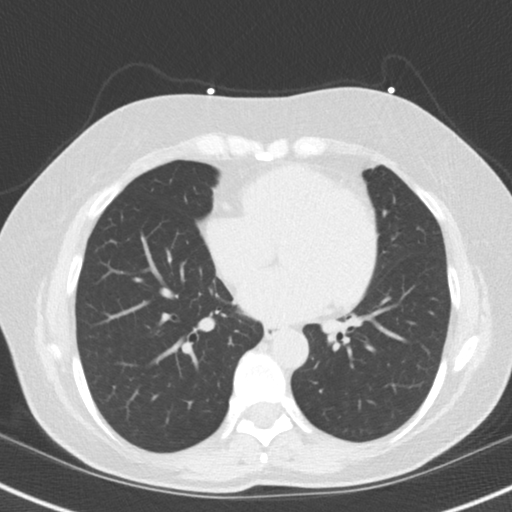
[im 24/44  lung]
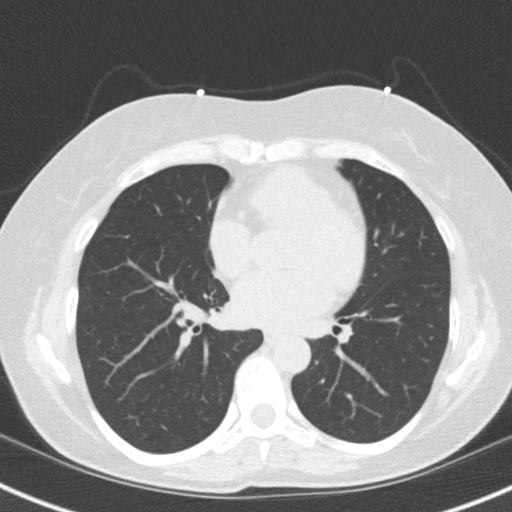
[im 29/44  lung]
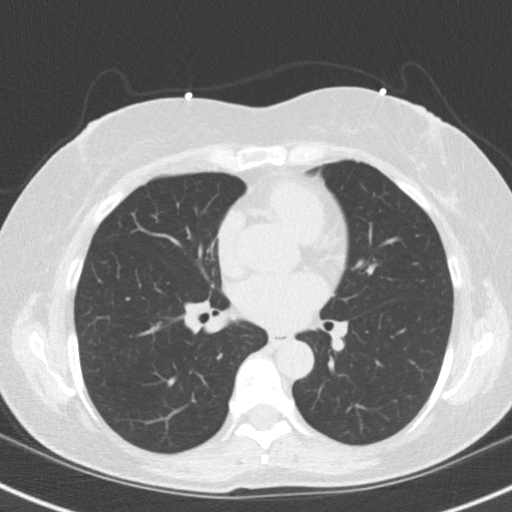
[im 34/44  lung]
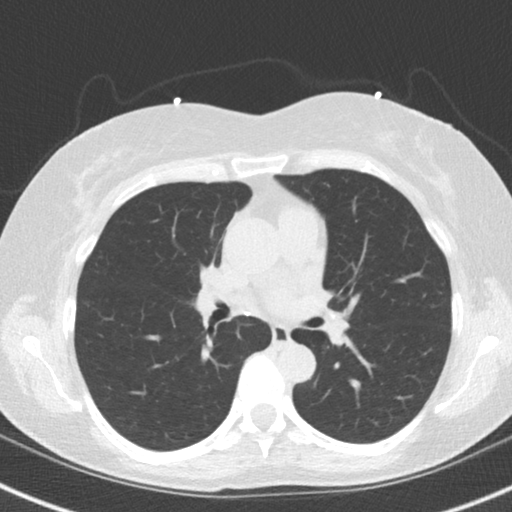
[im 39/44  lung]
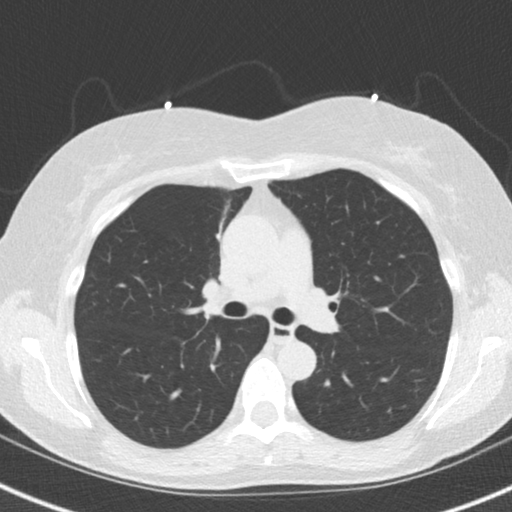

[16 of 20 positions shown; findings below may reference images not displayed]

FINDINGS: Vascular: Heart is normal size.  Visualized aorta normal caliber.

Mediastinum/Nodes: No adenopathy in the lower mediastinum or hila.
Calcified subcarinal and left hilar lymph nodes.

Lungs/Pleura: No confluent opacities or effusions.

Upper Abdomen: Imaging into the upper abdomen shows no acute
findings.

Musculoskeletal: Chest wall soft tissues are unremarkable. No acute
bony abnormality.
IMPRESSION: No acute extra cardiac abnormality.
FINDINGS: Non-cardiac: See separate report from [REDACTED].

Ascending Aorta: Normal size, trivial calcifications.

Pericardium: Normal.

Coronary arteries: Normal origin.
IMPRESSION: Coronary calcium score of 0. This was 0 percentile for age and sex
matched control.

*** End of Addendum ***
EXAM:
OVER-READ INTERPRETATION  CT CHEST

The following report is an over-read performed by radiologist Dr.
Pikus Cee [REDACTED] on 07/16/2019. This over-read
does not include interpretation of cardiac or coronary anatomy or
pathology. The coronary calcium score interpretation by the
cardiologist is attached.
FINDINGS: Vascular: Heart is normal size.  Visualized aorta normal caliber.

Mediastinum/Nodes: No adenopathy in the lower mediastinum or hila.
Calcified subcarinal and left hilar lymph nodes.

Lungs/Pleura: No confluent opacities or effusions.

Upper Abdomen: Imaging into the upper abdomen shows no acute
findings.

Musculoskeletal: Chest wall soft tissues are unremarkable. No acute
bony abnormality.
IMPRESSION: No acute extra cardiac abnormality.

## 2020-08-25 ENCOUNTER — Other Ambulatory Visit: Payer: Self-pay | Admitting: Internal Medicine

## 2020-08-25 DIAGNOSIS — I10 Essential (primary) hypertension: Secondary | ICD-10-CM

## 2020-09-11 ENCOUNTER — Other Ambulatory Visit: Payer: Self-pay | Admitting: Internal Medicine

## 2020-09-11 DIAGNOSIS — I10 Essential (primary) hypertension: Secondary | ICD-10-CM

## 2020-09-13 NOTE — Telephone Encounter (Signed)
°*  STAT* If patient is at the pharmacy, call can be transferred to refill team.   1. Which medications need to be refilled? (please list name of each medication and dose if known) Amlodipine  2. Which pharmacy/location (including street and city if local pharmacy) is medication to be sent to? [facility] [address], [place]  3. Do they need a 30 day or 90 day supply? enough h until her appt

## 2020-09-14 ENCOUNTER — Other Ambulatory Visit: Payer: Self-pay

## 2020-09-14 DIAGNOSIS — I10 Essential (primary) hypertension: Secondary | ICD-10-CM

## 2020-09-14 MED ORDER — AMLODIPINE BESYLATE 5 MG PO TABS: ORAL_TABLET | ORAL | 1 refills | Status: DC

## 2020-10-07 ENCOUNTER — Other Ambulatory Visit: Payer: Self-pay | Admitting: Cardiology

## 2020-10-07 DIAGNOSIS — I10 Essential (primary) hypertension: Secondary | ICD-10-CM

## 2020-10-09 ENCOUNTER — Encounter: Payer: Self-pay | Admitting: Physician Assistant
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
## 2020-10-12 ENCOUNTER — Encounter: Payer: Self-pay | Admitting: Physician Assistant

## 2020-10-12 ENCOUNTER — Other Ambulatory Visit: Payer: Self-pay

## 2020-10-12 ENCOUNTER — Ambulatory Visit: Payer: Managed Care, Other (non HMO) | Admitting: Physician Assistant

## 2020-10-12 VITALS — BP 130/90 | HR 68 | Ht 63.5 in | Wt 149.8 lb

## 2020-10-12 DIAGNOSIS — I493 Ventricular premature depolarization: Secondary | ICD-10-CM

## 2020-10-12 DIAGNOSIS — I491 Atrial premature depolarization: Secondary | ICD-10-CM

## 2020-10-12 DIAGNOSIS — I340 Nonrheumatic mitral (valve) insufficiency: Secondary | ICD-10-CM

## 2020-10-12 DIAGNOSIS — E785 Hyperlipidemia, unspecified: Secondary | ICD-10-CM

## 2020-10-12 DIAGNOSIS — I10 Essential (primary) hypertension: Secondary | ICD-10-CM | POA: Diagnosis not present

## 2020-10-12 MED ORDER — AMLODIPINE BESYLATE 5 MG PO TABS: ORAL_TABLET | ORAL | 3 refills | Status: DC

## 2020-10-12 NOTE — Patient Instructions (Addendum)
Medication Instructions:  Your physician recommends that you continue on your current medications as directed. Please refer to the Current Medication list given to you today.  *If you need a refill on your cardiac medications before your next appointment, please call your pharmacy*   Lab Work: None ordered  If you have labs (blood work) drawn today and your tests are completely normal, you will receive your results only by: Marland Kitchen MyChart Message (if you have MyChart) OR . A paper copy in the mail If you have any lab test that is abnormal or we need to change your treatment, we will call you to review the results.   Testing/Procedures: Your physician has requested that you have an echocardiogram. Echocardiography is a painless test that uses sound waves to create images of your heart. It provides your doctor with information about the size and shape of your heart and how well your heart's chambers and valves are working. This procedure takes approximately one hour. There are no restrictions for this procedure.     Follow-Up: At [facility], you and your health needs are our priority.  As part of our continuing mission to provide you with exceptional heart care, we have created designated Provider Care Teams.  These Care Teams include your primary Cardiologist (physician) and Advanced Practice Providers (APPs -  Physician Assistants and Nurse Practitioners) who all work together to provide you with the care you need, when you need it.  We recommend signing up for the patient portal called "MyChart".  Sign up information is provided on this After Visit Summary.  MyChart is used to connect with patients for Virtual Visits (Telemedicine).  Patients are able to view lab/test results, encounter notes, upcoming appointments, etc.  Non-urgent messages can be sent to your provider as well.   To learn more about what you can do with MyChart, go to [email or web address].    Your next appointment:    12 month(s)  The format for your next appointment:   In Person  Provider:   You may see Armanda Magic, MD or one of the following Advanced Practice Providers on your designated Care Team:    Ronie Spies, PA-C  Jacolyn Reedy, PA-C    Other Instructions  I would recommend using a blood pressure cuff that goes on your arm. The wrist ones can be inaccurate. If possible, try to select one that also reports your heart rate. To check your blood pressure, choose a time at least 3 hours after taking your blood pressure medicines. If you can sample it at different times of the day, that's great - it might give you more information about how your blood pressure fluctuates. Remain seated in a chair for 5 minutes quietly beforehand, then check it. Please record a list of those readings and call us/send in MyChart message with them for our review in 1 week.  Please le [place] know when you have your labs done so we can go and take a look at them.     Echocardiogram An echocardiogram is a test that uses sound waves (ultrasound) to produce images of the heart. Images from an echocardiogram can provide important information about:  Heart size and shape.  The size and thickness and movement of your heart's walls.  Heart muscle function and strength.  Heart valve function or if you have stenosis. Stenosis is when the heart valves are too narrow.  If blood is flowing backward through the heart valves (regurgitation).  A tumor or infectious growth  around the heart valves.  Areas of heart muscle that are not working well because of poor blood flow or injury from a heart attack.  Aneurysm detection. An aneurysm is a weak or damaged part of an artery wall. The wall bulges out from the normal force of blood pumping through the body. Tell a health care provider about:  Any allergies you have.  All medicines you are taking, including vitamins, herbs, eye drops, creams, and over-the-counter  medicines.  Any blood disorders you have.  Any surgeries you have had.  Any medical conditions you have.  Whether you are pregnant or may be pregnant. What are the risks? Generally, this is a safe test. However, problems may occur, including an allergic reaction to dye (contrast) that may be used during the test. What happens before the test? No specific preparation is needed. You may eat and drink normally. What happens during the test?  You will take off your clothes from the waist up and put on a hospital gown.  Electrodes or electrocardiogram (ECG)patches may be placed on your chest. The electrodes or patches are then connected to a device that monitors your heart rate and rhythm.  You will lie down on a table for an ultrasound exam. A gel will be applied to your chest to help sound waves pass through your skin.  A handheld device, called a transducer, will be pressed against your chest and moved over your heart. The transducer produces sound waves that travel to your heart and bounce back (or "echo" back) to the transducer. These sound waves will be captured in real-time and changed into images of your heart that can be viewed on a video monitor. The images will be recorded on a computer and reviewed by your health care provider.  You may be asked to change positions or hold your breath for a short time. This makes it easier to get different views or better views of your heart.  In some cases, you may receive contrast through an IV in one of your veins. This can improve the quality of the pictures from your heart. The procedure may vary among health care providers and hospitals.   What can I expect after the test? You may return to your normal, everyday life, including diet, activities, and medicines, unless your health care provider tells you not to do that. Follow these instructions at home:  It is up to you to get the results of your test. Ask your health care provider, or the  department that is doing the test, when your results will be ready.  Keep all follow-up visits. This is important. Summary  An echocardiogram is a test that uses sound waves (ultrasound) to produce images of the heart.  Images from an echocardiogram can provide important information about the size and shape of your heart, heart muscle function, heart valve function, and other possible heart problems.  You do not need to do anything to prepare before this test. You may eat and drink normally.  After the echocardiogram is completed, you may return to your normal, everyday life, unless your health care provider tells you not to do that. This information is not intended to replace advice given to you by your health care provider. Make sure you discuss any questions you have with your health care provider. Document Revised: 02/17/2020 Document Reviewed: 02/17/2020 Elsevier Patient Education  2021 [employer].

## 2020-10-12 NOTE — Addendum Note (Signed)
Addended by: Burnetta Sabin on: 10/12/2020 09:03 AM   Modules accepted: Orders

## 2020-12-30 ENCOUNTER — Other Ambulatory Visit: Payer: Self-pay

## 2020-12-30 ENCOUNTER — Ambulatory Visit (HOSPITAL_COMMUNITY): Payer: Managed Care, Other (non HMO) | Attending: Cardiology

## 2020-12-30 DIAGNOSIS — I340 Nonrheumatic mitral (valve) insufficiency: Secondary | ICD-10-CM

## 2020-12-30 LAB — ECHOCARDIOGRAM COMPLETE
Area-P 1/2: 3.6 cm2
MV M vel: 4.61 m/s
MV Peak grad: 85 mmHg
S' Lateral: 3.4 cm

## 2021-03-28 ENCOUNTER — Ambulatory Visit: Payer: Managed Care, Other (non HMO) | Admitting: Obstetrics & Gynecology

## 2021-04-15 ENCOUNTER — Other Ambulatory Visit: Payer: Self-pay

## 2021-04-15 ENCOUNTER — Ambulatory Visit (INDEPENDENT_AMBULATORY_CARE_PROVIDER_SITE_OTHER): Payer: BC Managed Care – PPO | Admitting: Obstetrics & Gynecology

## 2021-04-15 ENCOUNTER — Encounter: Payer: Self-pay | Admitting: Obstetrics & Gynecology

## 2021-04-15 VITALS — BP 120/78 | HR 74 | Resp 16 | Ht 62.75 in | Wt 153.0 lb

## 2021-04-15 DIAGNOSIS — M85851 Other specified disorders of bone density and structure, right thigh: Secondary | ICD-10-CM | POA: Diagnosis not present

## 2021-04-15 DIAGNOSIS — Z01419 Encounter for gynecological examination (general) (routine) without abnormal findings: Secondary | ICD-10-CM | POA: Diagnosis not present

## 2021-04-15 DIAGNOSIS — Z780 Asymptomatic menopausal state: Secondary | ICD-10-CM

## 2021-04-15 DIAGNOSIS — Z90710 Acquired absence of both cervix and uterus: Secondary | ICD-10-CM

## 2021-04-15 NOTE — Progress Notes (Signed)
Alicia Irwin 04-08-1961 [id number]   History:    60 y.o. . G2P2A0L1 Married.  Husband is a Optician, dispensing, were on the road for a few years, but now back in [place].  Patient works for a [employer]   RP:  Established patient presenting for annual gyn exam    HPI: S/P total Hysterectomy. Menopause, well on no HRT.  No pelvic pain.  No pain with IC.  Urine normal.  Minimal SUI, doing Kegel exercises. Occasional urgency when bladder is overfilled.  BMs normal.  Breasts normal.  BMI 27.32.  Good fitness.  Health labs with Fam MD.  Alen Bleacher scheduled 07/2021.  BD 2019 mild Osteopenia at Rt Fem Neck T-Score -1.4, all other sites normal.   Past medical history,surgical history, family history and social history were all reviewed and documented in the EPIC chart.  Gynecologic History No LMP recorded. Patient has had a hysterectomy.  Obstetric History OB History  Gravida Para Term Preterm AB Living  2 2       2   SAB IAB Ectopic Multiple Live Births               # Outcome Date GA Lbr Len/2nd Weight Sex Delivery Anes PTL Lv  2 Para           1 Para              ROS: A ROS was performed and pertinent positives and negatives are included in the history.  GENERAL: No fevers or chills. HEENT: No change in vision, no earache, sore throat or sinus congestion. NECK: No pain or stiffness. CARDIOVASCULAR: No chest pain or pressure. No palpitations. PULMONARY: No shortness of breath, cough or wheeze. GASTROINTESTINAL: No abdominal pain, nausea, vomiting or diarrhea, melena or bright red blood per rectum. GENITOURINARY: No urinary frequency, urgency, hesitancy or dysuria. MUSCULOSKELETAL: No joint or muscle pain, no back pain, no recent trauma. DERMATOLOGIC: No rash, no itching, no lesions. ENDOCRINE: No polyuria, polydipsia, no heat or cold intolerance. No recent change in weight. HEMATOLOGICAL: No anemia or easy bruising or bleeding. NEUROLOGIC: No headache, seizures, numbness, tingling or  weakness. PSYCHIATRIC: No depression, no loss of interest in normal activity or change in sleep pattern.     Exam:   BP 120/78   Pulse 74   Resp 16   Ht 5' 2.75" (1.594 m)   Wt 153 lb (69.4 kg)   BMI 27.32 kg/m   Body mass index is 27.32 kg/m.  General appearance : Well developed well nourished female. No acute distress HEENT: Eyes: no retinal hemorrhage or exudates,  Neck supple, trachea midline, no carotid bruits, no thyroidmegaly Lungs: Clear to auscultation, no rhonchi or wheezes, or rib retractions  Heart: Regular rate and rhythm, no murmurs or gallops Breast:Examined in sitting and supine position were symmetrical in appearance, no palpable masses or tenderness,  no skin retraction, no nipple inversion, no nipple discharge, no skin discoloration, no axillary or supraclavicular lymphadenopathy Abdomen: no palpable masses or tenderness, no rebound or guarding Extremities: no edema or skin discoloration or tenderness  Pelvic: Vulva: Normal             Vagina: No gross lesions or discharge  Cervix/Uterus absent  Adnexa  Without masses or tenderness  Anus: Normal   Assessment/Plan:  60 y.o. female for annual exam   1. Well female exam with routine gynecological exam Gynecologic exam s/p Total Hysterectomy.  No indication for a Pap test at this time.  Breasts  normal.  Will schedule a screening mammo at [facility].  Colono scheduled 07/2021.  Health labs Fam MD.  BMI 27.32.  Planning to resume fitness activities and lower Calorie/Carb diet.  2. S/P total hysterectomy  3. Postmenopause Well on no HRT.  4. Osteopenia of neck of right femur Very mild Osteopenia on BD 02/2018 at the Rt Fem Neck T-Score -1.4, all other sites normal.  Will repeat BD at 5 yrs.  Vit D, Ca++ 1.5 g/d total, regular weight bearing physical activities.  Other orders - amLODipine (NORVASC) 10 MG tablet; Take 10 mg by mouth daily.   Genia Del MD, 10:03 AM 04/15/2021

## 2021-05-27 DIAGNOSIS — Z1231 Encounter for screening mammogram for malignant neoplasm of breast: Secondary | ICD-10-CM | POA: Diagnosis not present

## 2021-05-30 ENCOUNTER — Encounter: Payer: Self-pay | Admitting: Obstetrics & Gynecology

## 2021-09-20 DIAGNOSIS — M25512 Pain in left shoulder: Secondary | ICD-10-CM | POA: Diagnosis not present

## 2021-09-20 DIAGNOSIS — M67912 Unspecified disorder of synovium and tendon, left shoulder: Secondary | ICD-10-CM | POA: Diagnosis not present

## 2021-10-21 DIAGNOSIS — K649 Unspecified hemorrhoids: Secondary | ICD-10-CM | POA: Diagnosis not present

## 2021-10-21 DIAGNOSIS — K5730 Diverticulosis of large intestine without perforation or abscess without bleeding: Secondary | ICD-10-CM | POA: Diagnosis not present

## 2021-10-21 DIAGNOSIS — Z1211 Encounter for screening for malignant neoplasm of colon: Secondary | ICD-10-CM | POA: Diagnosis not present

## 2021-10-28 ENCOUNTER — Telehealth: Payer: Self-pay | Admitting: Physician Assistant

## 2021-10-28 MED ORDER — AMLODIPINE BESYLATE 10 MG PO TABS: 10.0000 mg | ORAL_TABLET | Freq: Every day | ORAL | 0 refills | Status: DC

## 2021-10-28 NOTE — Telephone Encounter (Signed)
?*  STAT* If patient is at the pharmacy, call can be transferred to refill team. ? ? ?1. Which medications need to be refilled? (please list name of each medication and dose if known)  ?amLODipine (NORVASC) 10 MG tablet ? ?2. Which pharmacy/location (including street and city if local pharmacy) is medication to be sent to? ?[id number] - [place], [place] - [address] ? ?3. Do they need a 30 day or 90 day supply?  ?90 day supply ? ?

## 2021-10-28 NOTE — Telephone Encounter (Signed)
Pt scheduled to see Ronie Spies, PA-C, 12/07/21, #[facility] to [facility].  ?

## 2021-11-30 DIAGNOSIS — I10 Essential (primary) hypertension: Secondary | ICD-10-CM | POA: Diagnosis not present

## 2021-11-30 DIAGNOSIS — Z0000 Encounter for general adult medical examination without abnormal findings: Secondary | ICD-10-CM | POA: Diagnosis not present

## 2021-11-30 DIAGNOSIS — E782 Mixed hyperlipidemia: Secondary | ICD-10-CM | POA: Diagnosis not present

## 2021-12-05 ENCOUNTER — Encounter: Payer: Self-pay | Admitting: Physician Assistant
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
## 2021-12-07 ENCOUNTER — Ambulatory Visit: Payer: Managed Care, Other (non HMO) | Admitting: Physician Assistant

## 2021-12-07 DIAGNOSIS — I10 Essential (primary) hypertension: Secondary | ICD-10-CM

## 2021-12-07 DIAGNOSIS — I340 Nonrheumatic mitral (valve) insufficiency: Secondary | ICD-10-CM

## 2021-12-07 DIAGNOSIS — I493 Ventricular premature depolarization: Secondary | ICD-10-CM

## 2021-12-07 DIAGNOSIS — E782 Mixed hyperlipidemia: Secondary | ICD-10-CM

## 2021-12-07 DIAGNOSIS — I491 Atrial premature depolarization: Secondary | ICD-10-CM
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
## 2022-01-16 ENCOUNTER — Ambulatory Visit (INDEPENDENT_AMBULATORY_CARE_PROVIDER_SITE_OTHER): Payer: BC Managed Care – PPO | Admitting: Physician Assistant

## 2022-01-16 ENCOUNTER — Encounter: Payer: Self-pay | Admitting: Physician Assistant

## 2022-01-16 VITALS — BP 134/80 | HR 68 | Ht 63.0 in | Wt 154.0 lb

## 2022-01-16 DIAGNOSIS — I493 Ventricular premature depolarization: Secondary | ICD-10-CM | POA: Diagnosis not present

## 2022-01-16 DIAGNOSIS — I491 Atrial premature depolarization: Secondary | ICD-10-CM

## 2022-01-16 DIAGNOSIS — I10 Essential (primary) hypertension: Secondary | ICD-10-CM | POA: Diagnosis not present

## 2022-01-16 DIAGNOSIS — I340 Nonrheumatic mitral (valve) insufficiency: Secondary | ICD-10-CM

## 2022-01-16 DIAGNOSIS — E785 Hyperlipidemia, unspecified: Secondary | ICD-10-CM | POA: Diagnosis not present

## 2022-01-16 MED ORDER — AMLODIPINE BESYLATE 10 MG PO TABS: 10.0000 mg | ORAL_TABLET | Freq: Every day | ORAL | 3 refills | Status: DC

## 2022-01-16 NOTE — Patient Instructions (Addendum)
Medication Instructions:  Your physician recommends that you continue on your current medications as directed. Please refer to the Current Medication list given to you today. *If you need a refill on your cardiac medications before your next appointment, please call your pharmacy*   Lab Work: End of July early Aug FASTING LIPIDS & LFTs If you have labs (blood work) drawn today and your tests are completely normal, you will receive your results only by: MyChart Message (if you have MyChart) OR A paper copy in the mail If you have any lab test that is abnormal or we need to change your treatment, we will call you to review the results.   Testing/Procedures: Your physician has requested that you have an echocardiogram. Echocardiography is a painless test that uses sound waves to create images of your heart. It provides your doctor with information about the size and shape of your heart and how well your heart's chambers and valves are working. This procedure takes approximately one hour. There are no restrictions for this procedure. Schedule for June 2024  Follow-Up: At [facility], you and your health needs are our priority.  As part of our continuing mission to provide you with exceptional heart care, we have created designated Provider Care Teams.  These Care Teams include your primary Cardiologist (physician) and Advanced Practice Providers (APPs -  Physician Assistants and Nurse Practitioners) who all work together to provide you with the care you need, when you need it.  We recommend signing up for the patient portal called "MyChart".  Sign up information is provided on this After Visit Summary.  MyChart is used to connect with patients for Virtual Visits (Telemedicine).  Patients are able to view lab/test results, encounter notes, upcoming appointments, etc.  Non-urgent messages can be sent to your provider as well.   To learn more about what you can do with MyChart, go to  [email or web address].    Your next appointment:   1 year(s)  The format for your next appointment:   In Person  Provider:   Ronie Spies, PA-C         Other Instructions Please send [place] a mychart message closer to the last week to July or early August to arrange cholesterol check.   We need to get a better idea of what your blood pressure is running at home. Here are some instructions to follow: - I would recommend using a blood pressure cuff that goes on your arm. The wrist ones can be inaccurate. If you're purchasing one for the first time, try to select one that also reports your heart rate because this can be helpful information as well. - To check your blood pressure, choose a time at least 3 hours after taking your blood pressure medicines. If you can sample it at different times of the day, that's great - it might give you more information about how your blood pressure fluctuates. Remain seated in a chair for 5 minutes quietly beforehand, then check it.  - Call us if you tend to get readings of greater than 130 on the top number or 80 on the bottom number.    Important Information About Sugar

## 2022-01-31 DIAGNOSIS — Z79899 Other long term (current) drug therapy: Secondary | ICD-10-CM

## 2022-01-31 DIAGNOSIS — I10 Essential (primary) hypertension: Secondary | ICD-10-CM

## 2022-01-31 MED ORDER — CHLORTHALIDONE 25 MG PO TABS: 12.5000 mg | ORAL_TABLET | Freq: Every day | ORAL | 3 refills | Status: DC

## 2022-01-31 NOTE — Telephone Encounter (Signed)
BP remains above goal. Amlodipine already at max dose. Recommend add chlorthalidone 12.5mg  once daily Recheck BMET 1 week after starting at which time she should also submit BP readings via Mychart

## 2022-02-08 ENCOUNTER — Other Ambulatory Visit: Payer: BC Managed Care – PPO

## 2022-02-08 ENCOUNTER — Other Ambulatory Visit: Payer: Self-pay | Admitting: *Deleted

## 2022-02-08 DIAGNOSIS — E785 Hyperlipidemia, unspecified: Secondary | ICD-10-CM

## 2022-02-08 DIAGNOSIS — I10 Essential (primary) hypertension: Secondary | ICD-10-CM

## 2022-02-08 DIAGNOSIS — Z79899 Other long term (current) drug therapy: Secondary | ICD-10-CM

## 2022-02-08 DIAGNOSIS — E7800 Pure hypercholesterolemia, unspecified: Secondary | ICD-10-CM | POA: Diagnosis not present

## 2022-02-08 LAB — LIPID PANEL
Chol/HDL Ratio: 2.4 ratio (ref 0.0–4.4)
Cholesterol, Total: 191 mg/dL (ref 100–199)
HDL: 78 mg/dL (ref >39)
LDL Chol Calc (NIH): 92 mg/dL (ref 0–99)
Triglycerides: 123 mg/dL (ref 0–149)
VLDL Cholesterol Cal: 21 mg/dL (ref 5–40)

## 2022-02-08 LAB — BASIC METABOLIC PANEL
BUN/Creatinine Ratio: 19 (ref 12–28)
BUN: 16 mg/dL (ref 8–27)
CO2: 25 mmol/L (ref 20–29)
Calcium: 9.8 mg/dL (ref 8.7–10.3)
Chloride: 103 mmol/L (ref 96–106)
Creatinine, Ser: 0.83 mg/dL (ref 0.57–1.00)
Glucose: 88 mg/dL (ref 70–99)
Potassium: 4.1 mmol/L (ref 3.5–5.2)
Sodium: 142 mmol/L (ref 134–144)
eGFR: 80 mL/min/{1.73_m2} (ref >59)

## 2022-02-08 LAB — HEPATIC FUNCTION PANEL
ALT: 35 IU/L — ABNORMAL HIGH (ref 0–32)
AST: 36 IU/L (ref 0–40)
Albumin: 4.5 g/dL (ref 3.9–4.9)
Alkaline Phosphatase: 87 IU/L (ref 44–121)
Bilirubin Total: 0.6 mg/dL (ref 0.0–1.2)
Bilirubin, Direct: 0.15 mg/dL (ref 0.00–0.40)
Total Protein: 6.7 g/dL (ref 6.0–8.5)

## 2022-02-08 NOTE — Progress Notes (Signed)
Lab called inquiring pt is here for lab work as ordered by [employer], and no lab orders have been placed.  Did see at Dayna's last OV note with the pt that she wanted her to have a lipid, liver, and bmet done. Orders placed accordingly and lab will draw on the pt now.

## 2022-02-10 ENCOUNTER — Other Ambulatory Visit: Payer: Self-pay

## 2022-02-10 ENCOUNTER — Telehealth: Payer: Self-pay | Admitting: Cardiology

## 2022-02-10 DIAGNOSIS — R7401 Elevation of levels of liver transaminase levels: Secondary | ICD-10-CM

## 2022-02-10 NOTE — Telephone Encounter (Signed)
See result encounter

## 2022-02-10 NOTE — Progress Notes (Signed)
Can use dx elevated ALT. Discussed in clinic.

## 2022-02-10 NOTE — Telephone Encounter (Signed)
Pt returning call regarding results. Call transferred 

## 2022-02-23 DIAGNOSIS — L821 Other seborrheic keratosis: Secondary | ICD-10-CM | POA: Diagnosis not present

## 2022-02-23 DIAGNOSIS — B078 Other viral warts: Secondary | ICD-10-CM | POA: Diagnosis not present

## 2022-02-23 DIAGNOSIS — L578 Other skin changes due to chronic exposure to nonionizing radiation: Secondary | ICD-10-CM | POA: Diagnosis not present

## 2022-02-23 DIAGNOSIS — D2261 Melanocytic nevi of right upper limb, including shoulder: Secondary | ICD-10-CM | POA: Diagnosis not present

## 2022-02-23 DIAGNOSIS — L570 Actinic keratosis: Secondary | ICD-10-CM | POA: Diagnosis not present

## 2022-04-18 ENCOUNTER — Ambulatory Visit: Payer: BC Managed Care – PPO | Admitting: Obstetrics & Gynecology

## 2022-04-25 ENCOUNTER — Ambulatory Visit: Payer: BC Managed Care – PPO | Admitting: Obstetrics & Gynecology

## 2022-05-10 ENCOUNTER — Ambulatory Visit: Payer: BC Managed Care – PPO | Admitting: Obstetrics & Gynecology

## 2022-06-20 DIAGNOSIS — R7401 Elevation of levels of liver transaminase levels: Secondary | ICD-10-CM

## 2022-06-20 DIAGNOSIS — Z79899 Other long term (current) drug therapy: Secondary | ICD-10-CM

## 2022-06-27 ENCOUNTER — Ambulatory Visit: Payer: BC Managed Care – PPO | Attending: Cardiology

## 2022-06-27 DIAGNOSIS — R7401 Elevation of levels of liver transaminase levels: Secondary | ICD-10-CM

## 2022-06-27 LAB — ALT: ALT: 31 IU/L (ref 0–32)

## 2022-06-29 DIAGNOSIS — Z1231 Encounter for screening mammogram for malignant neoplasm of breast: Secondary | ICD-10-CM | POA: Diagnosis not present

## 2022-06-30 ENCOUNTER — Encounter: Payer: Self-pay | Admitting: Obstetrics & Gynecology

## 2022-07-13 ENCOUNTER — Encounter: Payer: Self-pay | Admitting: Obstetrics & Gynecology

## 2022-07-17 DIAGNOSIS — J069 Acute upper respiratory infection, unspecified: Secondary | ICD-10-CM | POA: Diagnosis not present

## 2022-07-28 DIAGNOSIS — S0501XA Injury of conjunctiva and corneal abrasion without foreign body, right eye, initial encounter: Secondary | ICD-10-CM | POA: Diagnosis not present

## 2022-08-23 ENCOUNTER — Ambulatory Visit (INDEPENDENT_AMBULATORY_CARE_PROVIDER_SITE_OTHER): Payer: BC Managed Care – PPO | Admitting: Obstetrics & Gynecology

## 2022-08-23 ENCOUNTER — Encounter: Payer: Self-pay | Admitting: Obstetrics & Gynecology

## 2022-08-23 VITALS — BP 118/76 | HR 83 | Ht 62.75 in | Wt 154.0 lb

## 2022-08-23 DIAGNOSIS — Z780 Asymptomatic menopausal state: Secondary | ICD-10-CM | POA: Diagnosis not present

## 2022-08-23 DIAGNOSIS — Z01419 Encounter for gynecological examination (general) (routine) without abnormal findings: Secondary | ICD-10-CM

## 2022-08-23 DIAGNOSIS — Z90710 Acquired absence of both cervix and uterus: Secondary | ICD-10-CM | POA: Diagnosis not present

## 2022-08-23 DIAGNOSIS — M85851 Other specified disorders of bone density and structure, right thigh: Secondary | ICD-10-CM | POA: Diagnosis not present

## 2022-08-23 NOTE — Progress Notes (Signed)
Alicia Irwin [birth year] [id number]   History:    62 y.o.  [id number] Married.  Husband is a Company secretary, were on the road for a few years, but now back in [place].  Patient works for a [employer].   RP:  Established patient presenting for annual gyn exam    HPI: S/P total Hysterectomy. Postmenopause, well on no HRT.  No pelvic pain. No pain with IC. Pap Neg 03/2020.  No h/o abnormal Pap.  Will repeat Pap at 4 years. Urine normal.  Minimal SUI, doing Kegel exercises. Occasional urgency when bladder is overfilled.  BMs normal. Breasts normal. Mammo Neg 06/2022. BMI 27.5.  Planning to restart with regular fitness and improve nutrition.  Father died last year and probably separating from husband.  Recommend seeing a Psychologist. Health labs with Fam MD.  Harriet Masson 07/2021.  BD 2019 mild Osteopenia at Rt Fem Neck T-Score -1.4, all other sites normal.  Will repeat BD at age 7.  On Crestor and Norvasc with Fam PA.  Past medical history,surgical history, family history and social history were all reviewed and documented in the EPIC chart.  Gynecologic History No LMP recorded. Patient has had a hysterectomy.  Obstetric History OB History  Gravida Para Term Preterm AB Living  2 2 2     2  $ SAB IAB Ectopic Multiple Live Births               # Outcome Date GA Lbr Len/2nd Weight Sex Delivery Anes PTL Lv  2 Term           1 Term              ROS: A ROS was performed and pertinent positives and negatives are included in the history. GENERAL: No fevers or chills. HEENT: No change in vision, no earache, sore throat or sinus congestion. NECK: No pain or stiffness. CARDIOVASCULAR: No chest pain or pressure. No palpitations. PULMONARY: No shortness of breath, cough or wheeze. GASTROINTESTINAL: No abdominal pain, nausea, vomiting or diarrhea, melena or bright red blood per rectum. GENITOURINARY: No urinary frequency, urgency, hesitancy or dysuria. MUSCULOSKELETAL: No joint or muscle pain, no back  pain, no recent trauma. DERMATOLOGIC: No rash, no itching, no lesions. ENDOCRINE: No polyuria, polydipsia, no heat or cold intolerance. No recent change in weight. HEMATOLOGICAL: No anemia or easy bruising or bleeding. NEUROLOGIC: No headache, seizures, numbness, tingling or weakness. PSYCHIATRIC: No depression, no loss of interest in normal activity or change in sleep pattern.     Exam:   BP 118/76   Pulse 83   Ht 5' 2.75" (1.594 m)   Wt 154 lb (69.9 kg)   SpO2 99%   BMI 27.50 kg/m   Body mass index is 27.5 kg/m.  General appearance : Well developed well nourished female. No acute distress HEENT: Eyes: no retinal hemorrhage or exudates,  Neck supple, trachea midline, no carotid bruits, no thyroidmegaly Lungs: Clear to auscultation, no rhonchi or wheezes, or rib retractions  Heart: Regular rate and rhythm, no murmurs or gallops Breast:Examined in sitting and supine position were symmetrical in appearance, no palpable masses or tenderness,  no skin retraction, no nipple inversion, no nipple discharge, no skin discoloration, no axillary or supraclavicular lymphadenopathy Abdomen: no palpable masses or tenderness, no rebound or guarding Extremities: no edema or skin discoloration or tenderness  Pelvic: Vulva: Normal             Vagina: No gross lesions or discharge  Cervix/Uterus absent  Adnexa  Without masses or tenderness  Anus: Normal   Assessment/Plan:  62 y.o. female for annual exam   1. Well female exam with routine gynecological exam S/P total Hysterectomy. Postmenopause, well on no HRT.  No pelvic pain. No pain with IC. Pap Neg 03/2020.  No h/o abnormal Pap.  Will repeat Pap at 4 years. Urine normal.  Minimal SUI, doing Kegel exercises. Occasional urgency when bladder is overfilled.  BMs normal. Breasts normal. Mammo Neg 06/2022. BMI 27.5.  Planning to restart with regular fitness and improve nutrition.  Father died last year and probably separating from husband.  Recommend  seeing a Psychologist. Health labs with Fam MD.  Harriet Masson 07/2021.  BD 2019 mild Osteopenia at Rt Fem Neck T-Score -1.4, all other sites normal.  Will repeat BD at age 38.  On Crestor and Norvasc with Fam PA.  2. S/P total hysterectomy  3. Postmenopause S/P total Hysterectomy. Postmenopause, well on no HRT.  No pelvic pain. No pain with IC.   4. Osteopenia of neck of right femur  BD 2019 mild Osteopenia at Rt Fem Neck T-Score -1.4, all other sites normal.  Will repeat BD at age 86.   Princess Bruins MD, 3:56 PM

## 2022-12-11 ENCOUNTER — Ambulatory Visit (HOSPITAL_COMMUNITY): Payer: BC Managed Care – PPO | Attending: Physician Assistant

## 2022-12-11 DIAGNOSIS — I340 Nonrheumatic mitral (valve) insufficiency: Secondary | ICD-10-CM | POA: Diagnosis not present

## 2022-12-11 DIAGNOSIS — E785 Hyperlipidemia, unspecified: Secondary | ICD-10-CM

## 2022-12-11 DIAGNOSIS — I10 Essential (primary) hypertension: Secondary | ICD-10-CM | POA: Diagnosis not present

## 2022-12-11 DIAGNOSIS — I493 Ventricular premature depolarization: Secondary | ICD-10-CM | POA: Diagnosis not present

## 2022-12-11 LAB — ECHOCARDIOGRAM COMPLETE
Area-P 1/2: 3.02 cm2
S' Lateral: 3.3 cm

## 2022-12-15 DIAGNOSIS — F430 Acute stress reaction: Secondary | ICD-10-CM | POA: Diagnosis not present

## 2022-12-15 DIAGNOSIS — I10 Essential (primary) hypertension: Secondary | ICD-10-CM | POA: Diagnosis not present

## 2022-12-15 DIAGNOSIS — Z23 Encounter for immunization: Secondary | ICD-10-CM | POA: Diagnosis not present

## 2022-12-15 DIAGNOSIS — Z1159 Encounter for screening for other viral diseases: Secondary | ICD-10-CM | POA: Diagnosis not present

## 2022-12-15 DIAGNOSIS — Z0000 Encounter for general adult medical examination without abnormal findings: Secondary | ICD-10-CM | POA: Diagnosis not present

## 2022-12-15 DIAGNOSIS — E782 Mixed hyperlipidemia: Secondary | ICD-10-CM | POA: Diagnosis not present

## 2022-12-15 DIAGNOSIS — F419 Anxiety disorder, unspecified: Secondary | ICD-10-CM | POA: Diagnosis not present

## 2023-01-16 ENCOUNTER — Other Ambulatory Visit: Payer: Self-pay | Admitting: Physician Assistant

## 2023-01-16 DIAGNOSIS — F4321 Adjustment disorder with depressed mood: Secondary | ICD-10-CM | POA: Diagnosis not present

## 2023-01-16 DIAGNOSIS — F419 Anxiety disorder, unspecified: Secondary | ICD-10-CM | POA: Diagnosis not present

## 2023-01-23 DIAGNOSIS — F4323 Adjustment disorder with mixed anxiety and depressed mood: Secondary | ICD-10-CM | POA: Diagnosis not present

## 2023-01-30 DIAGNOSIS — F4323 Adjustment disorder with mixed anxiety and depressed mood: Secondary | ICD-10-CM | POA: Diagnosis not present

## 2023-02-06 DIAGNOSIS — F4323 Adjustment disorder with mixed anxiety and depressed mood: Secondary | ICD-10-CM | POA: Diagnosis not present

## 2023-02-11 ENCOUNTER — Other Ambulatory Visit: Payer: Self-pay | Admitting: Cardiology

## 2023-02-12 DIAGNOSIS — F4323 Adjustment disorder with mixed anxiety and depressed mood: Secondary | ICD-10-CM | POA: Diagnosis not present

## 2023-02-21 DIAGNOSIS — G4700 Insomnia, unspecified: Secondary | ICD-10-CM | POA: Diagnosis not present

## 2023-02-21 DIAGNOSIS — F4321 Adjustment disorder with depressed mood: Secondary | ICD-10-CM | POA: Diagnosis not present

## 2023-02-21 DIAGNOSIS — F419 Anxiety disorder, unspecified: Secondary | ICD-10-CM | POA: Diagnosis not present

## 2023-02-23 ENCOUNTER — Other Ambulatory Visit: Payer: Self-pay | Admitting: Cardiology

## 2023-02-27 DIAGNOSIS — F4323 Adjustment disorder with mixed anxiety and depressed mood: Secondary | ICD-10-CM | POA: Diagnosis not present

## 2023-03-01 DIAGNOSIS — L821 Other seborrheic keratosis: Secondary | ICD-10-CM | POA: Diagnosis not present

## 2023-03-01 DIAGNOSIS — D2261 Melanocytic nevi of right upper limb, including shoulder: Secondary | ICD-10-CM | POA: Diagnosis not present

## 2023-03-01 DIAGNOSIS — L578 Other skin changes due to chronic exposure to nonionizing radiation: Secondary | ICD-10-CM | POA: Diagnosis not present

## 2023-03-01 DIAGNOSIS — B078 Other viral warts: Secondary | ICD-10-CM | POA: Diagnosis not present

## 2023-03-05 DIAGNOSIS — F4323 Adjustment disorder with mixed anxiety and depressed mood: Secondary | ICD-10-CM | POA: Diagnosis not present

## 2023-03-12 ENCOUNTER — Other Ambulatory Visit: Payer: Self-pay | Admitting: Cardiology

## 2023-03-13 DIAGNOSIS — F4323 Adjustment disorder with mixed anxiety and depressed mood: Secondary | ICD-10-CM | POA: Diagnosis not present

## 2023-03-13 DIAGNOSIS — F4321 Adjustment disorder with depressed mood: Secondary | ICD-10-CM | POA: Diagnosis not present

## 2023-03-13 DIAGNOSIS — F419 Anxiety disorder, unspecified: Secondary | ICD-10-CM | POA: Diagnosis not present

## 2023-03-13 DIAGNOSIS — F329 Major depressive disorder, single episode, unspecified: Secondary | ICD-10-CM | POA: Diagnosis not present

## 2023-03-21 DIAGNOSIS — F4323 Adjustment disorder with mixed anxiety and depressed mood: Secondary | ICD-10-CM | POA: Diagnosis not present

## 2023-03-28 DIAGNOSIS — F4323 Adjustment disorder with mixed anxiety and depressed mood: Secondary | ICD-10-CM | POA: Diagnosis not present

## 2023-04-04 DIAGNOSIS — F4323 Adjustment disorder with mixed anxiety and depressed mood: Secondary | ICD-10-CM | POA: Diagnosis not present

## 2023-04-11 DIAGNOSIS — F4323 Adjustment disorder with mixed anxiety and depressed mood: Secondary | ICD-10-CM | POA: Diagnosis not present

## 2023-04-17 DIAGNOSIS — F4323 Adjustment disorder with mixed anxiety and depressed mood: Secondary | ICD-10-CM | POA: Diagnosis not present

## 2023-04-24 DIAGNOSIS — F4323 Adjustment disorder with mixed anxiety and depressed mood: Secondary | ICD-10-CM | POA: Diagnosis not present

## 2023-05-08 DIAGNOSIS — F4323 Adjustment disorder with mixed anxiety and depressed mood: Secondary | ICD-10-CM | POA: Diagnosis not present

## 2023-05-16 DIAGNOSIS — F4323 Adjustment disorder with mixed anxiety and depressed mood: Secondary | ICD-10-CM | POA: Diagnosis not present

## 2023-05-30 DIAGNOSIS — F4323 Adjustment disorder with mixed anxiety and depressed mood: Secondary | ICD-10-CM | POA: Diagnosis not present

## 2023-06-04 DIAGNOSIS — F4323 Adjustment disorder with mixed anxiety and depressed mood: Secondary | ICD-10-CM | POA: Diagnosis not present

## 2023-06-06 DIAGNOSIS — I10 Essential (primary) hypertension: Secondary | ICD-10-CM | POA: Diagnosis not present

## 2023-06-11 DIAGNOSIS — I10 Essential (primary) hypertension: Secondary | ICD-10-CM | POA: Diagnosis not present

## 2023-06-11 DIAGNOSIS — I493 Ventricular premature depolarization: Secondary | ICD-10-CM | POA: Diagnosis not present

## 2023-06-11 DIAGNOSIS — F419 Anxiety disorder, unspecified: Secondary | ICD-10-CM | POA: Diagnosis not present

## 2023-06-11 DIAGNOSIS — E782 Mixed hyperlipidemia: Secondary | ICD-10-CM | POA: Diagnosis not present

## 2023-06-14 DIAGNOSIS — F4323 Adjustment disorder with mixed anxiety and depressed mood: Secondary | ICD-10-CM | POA: Diagnosis not present

## 2023-06-20 DIAGNOSIS — F4323 Adjustment disorder with mixed anxiety and depressed mood: Secondary | ICD-10-CM | POA: Diagnosis not present

## 2023-07-10 DIAGNOSIS — Z1231 Encounter for screening mammogram for malignant neoplasm of breast: Secondary | ICD-10-CM | POA: Diagnosis not present

## 2023-07-18 DIAGNOSIS — F4323 Adjustment disorder with mixed anxiety and depressed mood: Secondary | ICD-10-CM | POA: Diagnosis not present

## 2023-07-25 DIAGNOSIS — L821 Other seborrheic keratosis: Secondary | ICD-10-CM | POA: Diagnosis not present

## 2023-07-25 DIAGNOSIS — L719 Rosacea, unspecified: Secondary | ICD-10-CM | POA: Diagnosis not present

## 2023-07-25 DIAGNOSIS — L219 Seborrheic dermatitis, unspecified: Secondary | ICD-10-CM | POA: Diagnosis not present

## 2023-07-26 DIAGNOSIS — F4323 Adjustment disorder with mixed anxiety and depressed mood: Secondary | ICD-10-CM | POA: Diagnosis not present
